# Patient Record
Sex: Male | Born: 1950 | Race: White | Hispanic: No | Marital: Married | Smoking: Never smoker
Health system: Southern US, Community
[De-identification: ages and names within clinical notes are randomized; demographics above are authoritative.]

## PROBLEM LIST (undated history)

## (undated) DIAGNOSIS — E119 Type 2 diabetes mellitus without complications: Secondary | ICD-10-CM

## (undated) DIAGNOSIS — I10 Essential (primary) hypertension: Secondary | ICD-10-CM

---

## 2016-09-21 DIAGNOSIS — E119 Type 2 diabetes mellitus without complications: Secondary | ICD-10-CM | POA: Diagnosis not present

## 2016-09-21 DIAGNOSIS — I10 Essential (primary) hypertension: Secondary | ICD-10-CM | POA: Diagnosis not present

## 2016-09-26 DIAGNOSIS — H9319 Tinnitus, unspecified ear: Secondary | ICD-10-CM | POA: Diagnosis not present

## 2016-09-26 DIAGNOSIS — E119 Type 2 diabetes mellitus without complications: Secondary | ICD-10-CM | POA: Diagnosis not present

## 2016-09-26 DIAGNOSIS — K4090 Unilateral inguinal hernia, without obstruction or gangrene, not specified as recurrent: Secondary | ICD-10-CM | POA: Diagnosis not present

## 2016-09-26 DIAGNOSIS — I10 Essential (primary) hypertension: Secondary | ICD-10-CM | POA: Diagnosis not present

## 2016-10-20 DIAGNOSIS — H9313 Tinnitus, bilateral: Secondary | ICD-10-CM | POA: Diagnosis not present

## 2016-10-20 DIAGNOSIS — H903 Sensorineural hearing loss, bilateral: Secondary | ICD-10-CM | POA: Diagnosis not present

## 2016-10-24 DIAGNOSIS — I10 Essential (primary) hypertension: Secondary | ICD-10-CM | POA: Diagnosis not present

## 2016-10-24 DIAGNOSIS — E118 Type 2 diabetes mellitus with unspecified complications: Secondary | ICD-10-CM | POA: Diagnosis not present

## 2016-10-26 DIAGNOSIS — K4090 Unilateral inguinal hernia, without obstruction or gangrene, not specified as recurrent: Secondary | ICD-10-CM | POA: Diagnosis not present

## 2016-11-30 DIAGNOSIS — K4090 Unilateral inguinal hernia, without obstruction or gangrene, not specified as recurrent: Secondary | ICD-10-CM | POA: Diagnosis not present

## 2017-01-18 DIAGNOSIS — I10 Essential (primary) hypertension: Secondary | ICD-10-CM | POA: Diagnosis not present

## 2017-01-18 DIAGNOSIS — E118 Type 2 diabetes mellitus with unspecified complications: Secondary | ICD-10-CM | POA: Diagnosis not present

## 2017-01-23 DIAGNOSIS — R609 Edema, unspecified: Secondary | ICD-10-CM | POA: Diagnosis not present

## 2017-01-23 DIAGNOSIS — E118 Type 2 diabetes mellitus with unspecified complications: Secondary | ICD-10-CM | POA: Diagnosis not present

## 2017-01-23 DIAGNOSIS — I10 Essential (primary) hypertension: Secondary | ICD-10-CM | POA: Diagnosis not present

## 2017-01-26 DIAGNOSIS — H52223 Regular astigmatism, bilateral: Secondary | ICD-10-CM | POA: Diagnosis not present

## 2017-01-26 DIAGNOSIS — H16223 Keratoconjunctivitis sicca, not specified as Sjogren's, bilateral: Secondary | ICD-10-CM | POA: Diagnosis not present

## 2017-01-26 DIAGNOSIS — H02834 Dermatochalasis of left upper eyelid: Secondary | ICD-10-CM | POA: Diagnosis not present

## 2017-01-26 DIAGNOSIS — H43813 Vitreous degeneration, bilateral: Secondary | ICD-10-CM | POA: Diagnosis not present

## 2017-01-26 DIAGNOSIS — H02831 Dermatochalasis of right upper eyelid: Secondary | ICD-10-CM | POA: Diagnosis not present

## 2017-01-26 DIAGNOSIS — Z961 Presence of intraocular lens: Secondary | ICD-10-CM | POA: Diagnosis not present

## 2017-01-26 DIAGNOSIS — E119 Type 2 diabetes mellitus without complications: Secondary | ICD-10-CM | POA: Diagnosis not present

## 2017-02-08 DIAGNOSIS — I10 Essential (primary) hypertension: Secondary | ICD-10-CM | POA: Diagnosis not present

## 2017-02-08 DIAGNOSIS — R609 Edema, unspecified: Secondary | ICD-10-CM | POA: Diagnosis not present

## 2017-02-08 DIAGNOSIS — E119 Type 2 diabetes mellitus without complications: Secondary | ICD-10-CM | POA: Diagnosis not present

## 2017-02-08 DIAGNOSIS — Z125 Encounter for screening for malignant neoplasm of prostate: Secondary | ICD-10-CM | POA: Diagnosis not present

## 2017-02-13 DIAGNOSIS — R040 Epistaxis: Secondary | ICD-10-CM | POA: Diagnosis not present

## 2017-04-04 DIAGNOSIS — R040 Epistaxis: Secondary | ICD-10-CM | POA: Diagnosis not present

## 2017-09-21 DIAGNOSIS — E119 Type 2 diabetes mellitus without complications: Secondary | ICD-10-CM | POA: Diagnosis not present

## 2017-09-21 DIAGNOSIS — I10 Essential (primary) hypertension: Secondary | ICD-10-CM | POA: Diagnosis not present

## 2017-09-21 DIAGNOSIS — Z125 Encounter for screening for malignant neoplasm of prostate: Secondary | ICD-10-CM | POA: Diagnosis not present

## 2017-09-28 DIAGNOSIS — E7800 Pure hypercholesterolemia, unspecified: Secondary | ICD-10-CM | POA: Diagnosis not present

## 2017-09-28 DIAGNOSIS — I10 Essential (primary) hypertension: Secondary | ICD-10-CM | POA: Diagnosis not present

## 2017-09-28 DIAGNOSIS — E119 Type 2 diabetes mellitus without complications: Secondary | ICD-10-CM | POA: Diagnosis not present

## 2017-10-25 DIAGNOSIS — R69 Illness, unspecified: Secondary | ICD-10-CM | POA: Diagnosis not present

## 2018-01-14 DIAGNOSIS — I10 Essential (primary) hypertension: Secondary | ICD-10-CM | POA: Diagnosis not present

## 2018-01-14 DIAGNOSIS — E119 Type 2 diabetes mellitus without complications: Secondary | ICD-10-CM | POA: Diagnosis not present

## 2018-01-14 DIAGNOSIS — E7800 Pure hypercholesterolemia, unspecified: Secondary | ICD-10-CM | POA: Diagnosis not present

## 2018-01-21 DIAGNOSIS — M5430 Sciatica, unspecified side: Secondary | ICD-10-CM | POA: Diagnosis not present

## 2018-01-21 DIAGNOSIS — Z0000 Encounter for general adult medical examination without abnormal findings: Secondary | ICD-10-CM | POA: Diagnosis not present

## 2018-01-21 DIAGNOSIS — I10 Essential (primary) hypertension: Secondary | ICD-10-CM | POA: Diagnosis not present

## 2018-01-21 DIAGNOSIS — M25552 Pain in left hip: Secondary | ICD-10-CM | POA: Diagnosis not present

## 2018-01-21 DIAGNOSIS — E119 Type 2 diabetes mellitus without complications: Secondary | ICD-10-CM | POA: Diagnosis not present

## 2018-01-21 DIAGNOSIS — E7800 Pure hypercholesterolemia, unspecified: Secondary | ICD-10-CM | POA: Diagnosis not present

## 2018-01-21 DIAGNOSIS — Z23 Encounter for immunization: Secondary | ICD-10-CM | POA: Diagnosis not present

## 2018-02-05 DIAGNOSIS — H16223 Keratoconjunctivitis sicca, not specified as Sjogren's, bilateral: Secondary | ICD-10-CM | POA: Diagnosis not present

## 2018-02-05 DIAGNOSIS — Z7984 Long term (current) use of oral hypoglycemic drugs: Secondary | ICD-10-CM | POA: Diagnosis not present

## 2018-02-05 DIAGNOSIS — H02831 Dermatochalasis of right upper eyelid: Secondary | ICD-10-CM | POA: Diagnosis not present

## 2018-02-05 DIAGNOSIS — H43813 Vitreous degeneration, bilateral: Secondary | ICD-10-CM | POA: Diagnosis not present

## 2018-02-05 DIAGNOSIS — H52223 Regular astigmatism, bilateral: Secondary | ICD-10-CM | POA: Diagnosis not present

## 2018-02-05 DIAGNOSIS — Z961 Presence of intraocular lens: Secondary | ICD-10-CM | POA: Diagnosis not present

## 2018-02-05 DIAGNOSIS — E119 Type 2 diabetes mellitus without complications: Secondary | ICD-10-CM | POA: Diagnosis not present

## 2018-02-05 DIAGNOSIS — H04123 Dry eye syndrome of bilateral lacrimal glands: Secondary | ICD-10-CM | POA: Diagnosis not present

## 2018-02-05 DIAGNOSIS — H02834 Dermatochalasis of left upper eyelid: Secondary | ICD-10-CM | POA: Diagnosis not present

## 2018-02-21 DIAGNOSIS — E119 Type 2 diabetes mellitus without complications: Secondary | ICD-10-CM | POA: Diagnosis not present

## 2018-02-21 DIAGNOSIS — T63481A Toxic effect of venom of other arthropod, accidental (unintentional), initial encounter: Secondary | ICD-10-CM | POA: Diagnosis not present

## 2018-05-03 DIAGNOSIS — H903 Sensorineural hearing loss, bilateral: Secondary | ICD-10-CM | POA: Diagnosis not present

## 2018-05-20 DIAGNOSIS — E119 Type 2 diabetes mellitus without complications: Secondary | ICD-10-CM | POA: Diagnosis not present

## 2018-05-20 DIAGNOSIS — I10 Essential (primary) hypertension: Secondary | ICD-10-CM | POA: Diagnosis not present

## 2018-05-23 DIAGNOSIS — E119 Type 2 diabetes mellitus without complications: Secondary | ICD-10-CM | POA: Diagnosis not present

## 2018-05-23 DIAGNOSIS — I10 Essential (primary) hypertension: Secondary | ICD-10-CM | POA: Diagnosis not present

## 2018-05-23 DIAGNOSIS — E7800 Pure hypercholesterolemia, unspecified: Secondary | ICD-10-CM | POA: Diagnosis not present

## 2018-05-28 DIAGNOSIS — R69 Illness, unspecified: Secondary | ICD-10-CM | POA: Diagnosis not present

## 2018-10-21 DIAGNOSIS — E119 Type 2 diabetes mellitus without complications: Secondary | ICD-10-CM | POA: Diagnosis not present

## 2018-10-21 DIAGNOSIS — E7800 Pure hypercholesterolemia, unspecified: Secondary | ICD-10-CM | POA: Diagnosis not present

## 2018-10-21 DIAGNOSIS — I10 Essential (primary) hypertension: Secondary | ICD-10-CM | POA: Diagnosis not present

## 2018-10-30 DIAGNOSIS — R69 Illness, unspecified: Secondary | ICD-10-CM | POA: Diagnosis not present

## 2018-10-31 DIAGNOSIS — R69 Illness, unspecified: Secondary | ICD-10-CM | POA: Diagnosis not present

## 2018-11-27 DIAGNOSIS — R69 Illness, unspecified: Secondary | ICD-10-CM | POA: Diagnosis not present

## 2019-01-17 DIAGNOSIS — Z0000 Encounter for general adult medical examination without abnormal findings: Secondary | ICD-10-CM | POA: Diagnosis not present

## 2019-01-17 DIAGNOSIS — E119 Type 2 diabetes mellitus without complications: Secondary | ICD-10-CM | POA: Diagnosis not present

## 2019-01-17 DIAGNOSIS — I10 Essential (primary) hypertension: Secondary | ICD-10-CM | POA: Diagnosis not present

## 2019-01-17 DIAGNOSIS — Z125 Encounter for screening for malignant neoplasm of prostate: Secondary | ICD-10-CM | POA: Diagnosis not present

## 2019-01-24 DIAGNOSIS — E7800 Pure hypercholesterolemia, unspecified: Secondary | ICD-10-CM | POA: Diagnosis not present

## 2019-01-24 DIAGNOSIS — Z0000 Encounter for general adult medical examination without abnormal findings: Secondary | ICD-10-CM | POA: Diagnosis not present

## 2019-01-24 DIAGNOSIS — I10 Essential (primary) hypertension: Secondary | ICD-10-CM | POA: Diagnosis not present

## 2019-01-24 DIAGNOSIS — E119 Type 2 diabetes mellitus without complications: Secondary | ICD-10-CM | POA: Diagnosis not present

## 2019-01-24 DIAGNOSIS — M5410 Radiculopathy, site unspecified: Secondary | ICD-10-CM | POA: Diagnosis not present

## 2019-01-31 ENCOUNTER — Other Ambulatory Visit (HOSPITAL_COMMUNITY): Payer: Self-pay | Admitting: Internal Medicine

## 2019-01-31 ENCOUNTER — Other Ambulatory Visit: Payer: Self-pay | Admitting: Internal Medicine

## 2019-01-31 DIAGNOSIS — M5410 Radiculopathy, site unspecified: Secondary | ICD-10-CM

## 2019-02-04 ENCOUNTER — Ambulatory Visit (HOSPITAL_COMMUNITY)
Admission: RE | Admit: 2019-02-04 | Discharge: 2019-02-04 | Disposition: A | Discharge status: Home / Self Care | Payer: Medicare HMO | Source: Ambulatory Visit | Attending: Internal Medicine | Admitting: Internal Medicine

## 2019-02-04 ENCOUNTER — Other Ambulatory Visit: Payer: Self-pay

## 2019-02-04 DIAGNOSIS — M545 Low back pain: Secondary | ICD-10-CM | POA: Diagnosis not present

## 2019-02-04 DIAGNOSIS — M5410 Radiculopathy, site unspecified: Secondary | ICD-10-CM | POA: Diagnosis not present

## 2019-02-11 DIAGNOSIS — H52223 Regular astigmatism, bilateral: Secondary | ICD-10-CM | POA: Diagnosis not present

## 2019-02-11 DIAGNOSIS — Z961 Presence of intraocular lens: Secondary | ICD-10-CM | POA: Diagnosis not present

## 2019-02-11 DIAGNOSIS — H02831 Dermatochalasis of right upper eyelid: Secondary | ICD-10-CM | POA: Diagnosis not present

## 2019-02-11 DIAGNOSIS — Z7984 Long term (current) use of oral hypoglycemic drugs: Secondary | ICD-10-CM | POA: Diagnosis not present

## 2019-02-11 DIAGNOSIS — H5201 Hypermetropia, right eye: Secondary | ICD-10-CM | POA: Diagnosis not present

## 2019-02-11 DIAGNOSIS — E119 Type 2 diabetes mellitus without complications: Secondary | ICD-10-CM | POA: Diagnosis not present

## 2019-02-11 DIAGNOSIS — H02834 Dermatochalasis of left upper eyelid: Secondary | ICD-10-CM | POA: Diagnosis not present

## 2019-02-11 DIAGNOSIS — H16223 Keratoconjunctivitis sicca, not specified as Sjogren's, bilateral: Secondary | ICD-10-CM | POA: Diagnosis not present

## 2019-02-11 DIAGNOSIS — H43813 Vitreous degeneration, bilateral: Secondary | ICD-10-CM | POA: Diagnosis not present

## 2019-02-11 DIAGNOSIS — H5212 Myopia, left eye: Secondary | ICD-10-CM | POA: Diagnosis not present

## 2019-02-21 DIAGNOSIS — M25552 Pain in left hip: Secondary | ICD-10-CM | POA: Diagnosis not present

## 2019-02-21 DIAGNOSIS — M4726 Other spondylosis with radiculopathy, lumbar region: Secondary | ICD-10-CM | POA: Diagnosis not present

## 2019-03-03 DIAGNOSIS — Z7984 Long term (current) use of oral hypoglycemic drugs: Secondary | ICD-10-CM | POA: Diagnosis not present

## 2019-03-03 DIAGNOSIS — E119 Type 2 diabetes mellitus without complications: Secondary | ICD-10-CM | POA: Diagnosis not present

## 2019-03-03 DIAGNOSIS — Z1211 Encounter for screening for malignant neoplasm of colon: Secondary | ICD-10-CM | POA: Diagnosis not present

## 2019-04-10 DIAGNOSIS — D123 Benign neoplasm of transverse colon: Secondary | ICD-10-CM | POA: Diagnosis not present

## 2019-04-10 DIAGNOSIS — Z1211 Encounter for screening for malignant neoplasm of colon: Secondary | ICD-10-CM | POA: Diagnosis not present

## 2019-04-15 DIAGNOSIS — D123 Benign neoplasm of transverse colon: Secondary | ICD-10-CM | POA: Diagnosis not present

## 2019-05-02 DIAGNOSIS — R69 Illness, unspecified: Secondary | ICD-10-CM | POA: Diagnosis not present

## 2019-05-30 DIAGNOSIS — Z23 Encounter for immunization: Secondary | ICD-10-CM | POA: Diagnosis not present

## 2019-07-18 DIAGNOSIS — I10 Essential (primary) hypertension: Secondary | ICD-10-CM | POA: Diagnosis not present

## 2019-07-18 DIAGNOSIS — E119 Type 2 diabetes mellitus without complications: Secondary | ICD-10-CM | POA: Diagnosis not present

## 2019-07-25 DIAGNOSIS — E119 Type 2 diabetes mellitus without complications: Secondary | ICD-10-CM | POA: Diagnosis not present

## 2019-07-25 DIAGNOSIS — E7800 Pure hypercholesterolemia, unspecified: Secondary | ICD-10-CM | POA: Diagnosis not present

## 2019-07-25 DIAGNOSIS — I10 Essential (primary) hypertension: Secondary | ICD-10-CM | POA: Diagnosis not present

## 2019-08-04 DIAGNOSIS — M1612 Unilateral primary osteoarthritis, left hip: Secondary | ICD-10-CM | POA: Diagnosis not present

## 2019-08-04 DIAGNOSIS — M25552 Pain in left hip: Secondary | ICD-10-CM | POA: Diagnosis not present

## 2019-08-28 DIAGNOSIS — R69 Illness, unspecified: Secondary | ICD-10-CM | POA: Diagnosis not present

## 2019-09-13 ENCOUNTER — Ambulatory Visit: Payer: Medicare HMO

## 2019-09-18 ENCOUNTER — Ambulatory Visit: Payer: Medicare HMO

## 2019-09-20 ENCOUNTER — Ambulatory Visit: Payer: Medicare HMO | Attending: Internal Medicine

## 2019-09-20 DIAGNOSIS — Z23 Encounter for immunization: Secondary | ICD-10-CM | POA: Insufficient documentation

## 2019-09-20 NOTE — Progress Notes (Signed)
   Covid-19 Vaccination Clinic  Name:  Kyle Gibson    MRN: [id number] DOB: [birth year]  09/20/2019  [name] was observed post Covid-19 immunization for 15 minutes without incidence. He was provided with Vaccine Information Sheet and instruction to access the V-Safe system.   [name] was instructed to call 911 with any severe reactions post vaccine: Marland Kitchen Difficulty breathing  . Swelling of your face and throat  . A fast heartbeat  . A bad rash all over your body  . Dizziness and weakness    Immunizations Administered    Name Date Dose VIS Date Route   Pfizer COVID-19 Vaccine 09/20/2019  6:00 PM 0.3 mL 07/25/2019 Intramuscular   Manufacturer: [place]   Lot: [id number]   [place]: [id number]

## 2019-10-15 ENCOUNTER — Ambulatory Visit: Payer: Medicare HMO

## 2019-10-27 ENCOUNTER — Ambulatory Visit: Payer: Medicare HMO | Attending: Internal Medicine

## 2019-10-27 DIAGNOSIS — Z23 Encounter for immunization: Secondary | ICD-10-CM

## 2019-10-27 NOTE — Progress Notes (Signed)
   Covid-19 Vaccination Clinic  Name:  Kyle Gibson    MRN: [id number] DOB: August 05, 1951  10/27/2019  Kyle Gibson was observed post Covid-19 immunization for 15 minutes without incident. He was provided with Vaccine Information Sheet and instruction to access the V-Safe system.   Kyle Gibson was instructed to call 911 with any severe reactions post vaccine: Marland Kitchen Difficulty breathing  . Swelling of face and throat  . A fast heartbeat  . A bad rash all over body  . Dizziness and weakness   Immunizations Administered    Name Date Dose VIS Date Route   Pfizer COVID-19 Vaccine 10/27/2019  8:24 AM 0.3 mL 07/25/2019 Intramuscular   Manufacturer: [place]   Lot: [id number]   [place]: [id number]

## 2019-10-31 ENCOUNTER — Other Ambulatory Visit (HOSPITAL_COMMUNITY): Payer: Self-pay | Admitting: Orthopaedic Surgery

## 2019-10-31 ENCOUNTER — Ambulatory Visit (HOSPITAL_COMMUNITY)
Admission: RE | Admit: 2019-10-31 | Discharge: 2019-10-31 | Disposition: A | Discharge status: Home / Self Care | Payer: Medicare HMO | Source: Ambulatory Visit | Attending: Orthopaedic Surgery | Admitting: Orthopaedic Surgery

## 2019-10-31 ENCOUNTER — Other Ambulatory Visit: Payer: Self-pay

## 2019-10-31 DIAGNOSIS — M7989 Other specified soft tissue disorders: Secondary | ICD-10-CM

## 2019-10-31 DIAGNOSIS — M79605 Pain in left leg: Secondary | ICD-10-CM

## 2020-04-07 IMAGING — MR MRI LUMBAR SPINE WITHOUT CONTRAST
4 of 5 series · 18 of 48 positions shown · non-contrast
Comparison: None.

CLINICAL DATA: Worsening low back pain radiating to the left leg
with numbness and tingling.

EXAM:
MRI LUMBAR SPINE WITHOUT CONTRAST
TECHNIQUE: Multiplanar, multisequence MR imaging of the lumbar spine was
performed. No intravenous contrast was administered.

[Series 3: T1 · sagittal · 4.0mm · 0.51mm/px · 3 of 14 slices shown (1 of 2)]
[im 1/14]
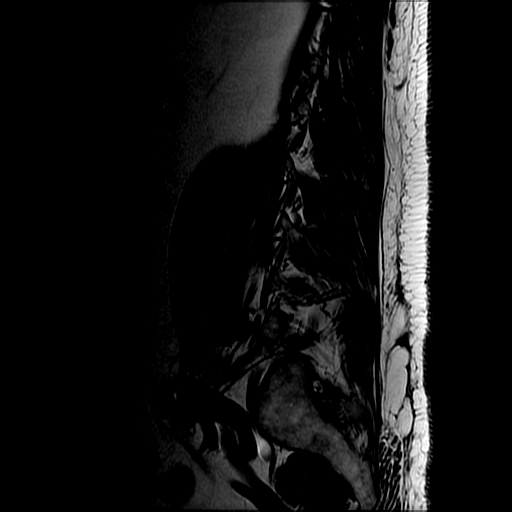
[im 7/14]
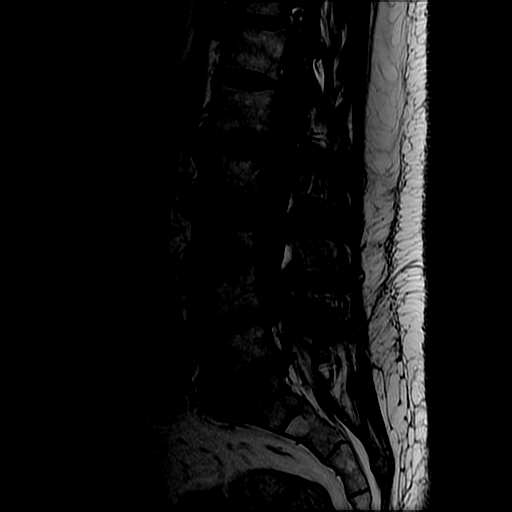
[im 14/14]
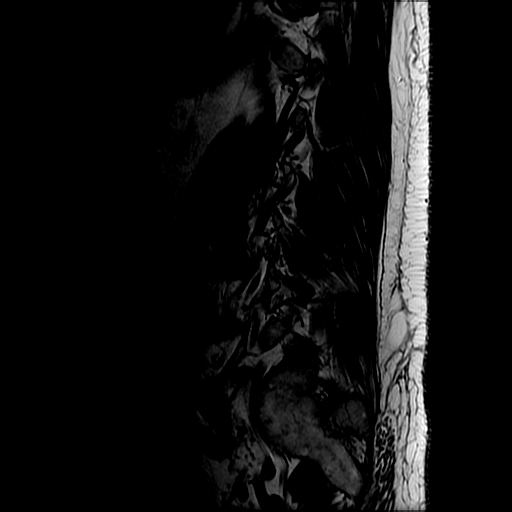

[Series 5: T2 post-contrast · sagittal · 4.0mm · 0.51mm/px · 6 of 14 slices shown]
[im 1/14]
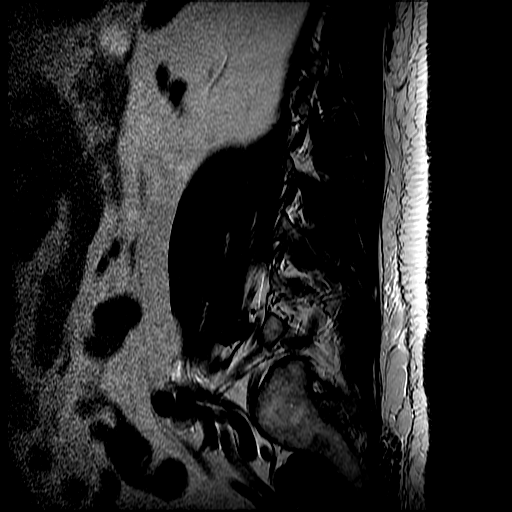
[im 3/14]
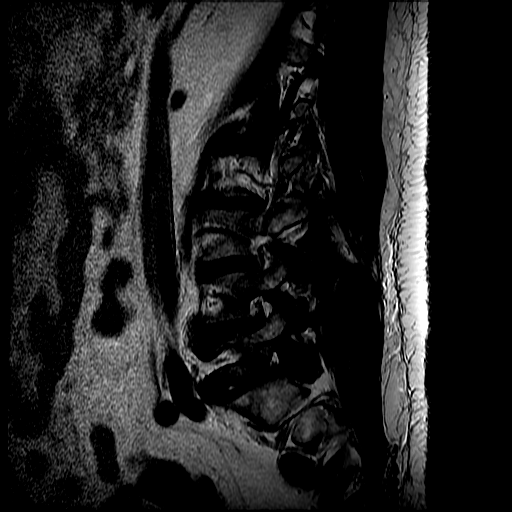
[im 6/14]
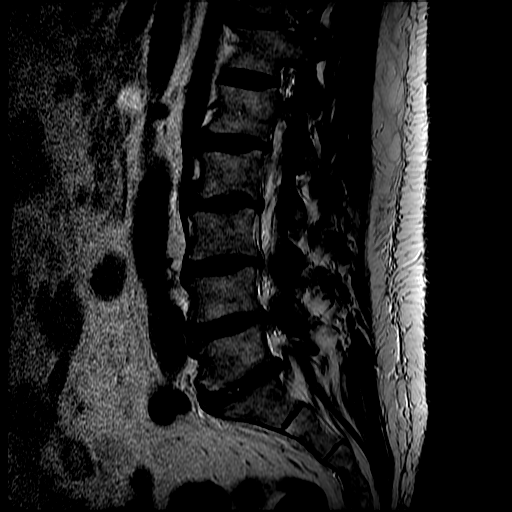
[im 8/14]
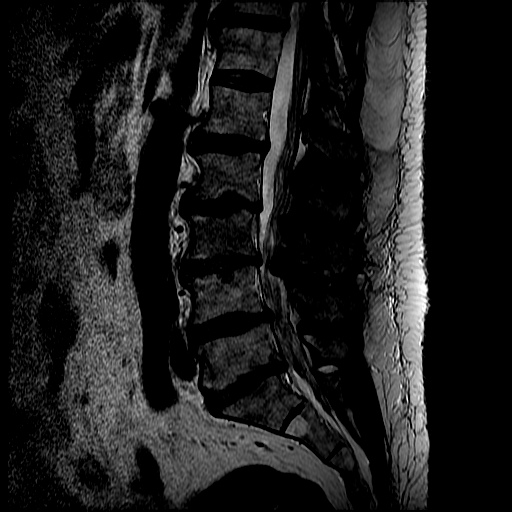
[im 11/14]
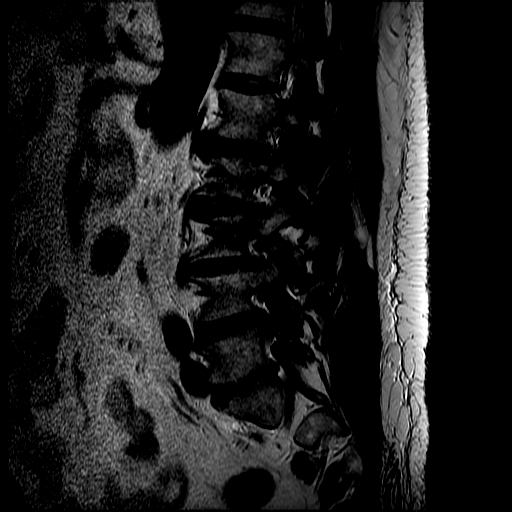
[im 14/14]
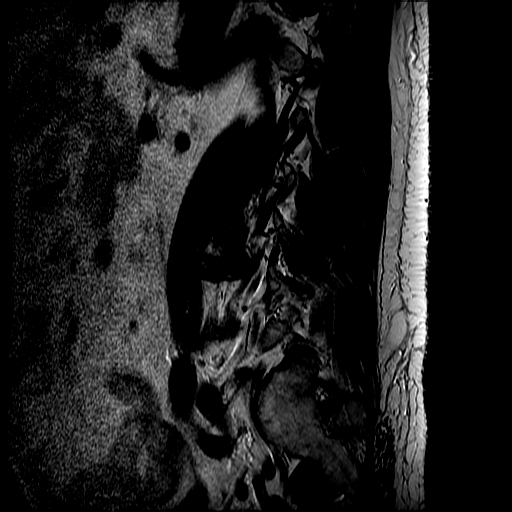

[Series 7: T2 · axial · 4.0mm · 0.41mm/px · z∈[+19,+196]mm · 6 of 41 slices shown]
[im 3/41]
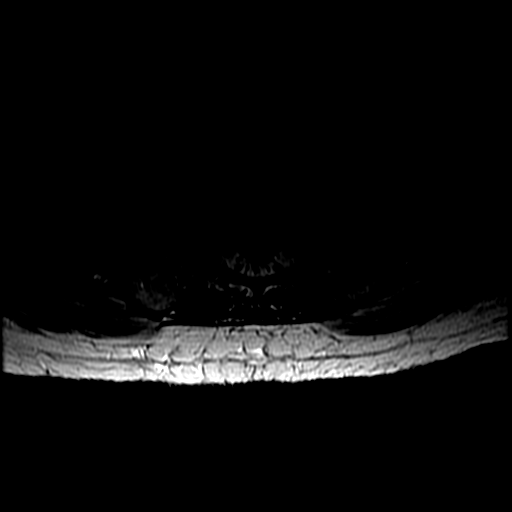
[im 6/41]
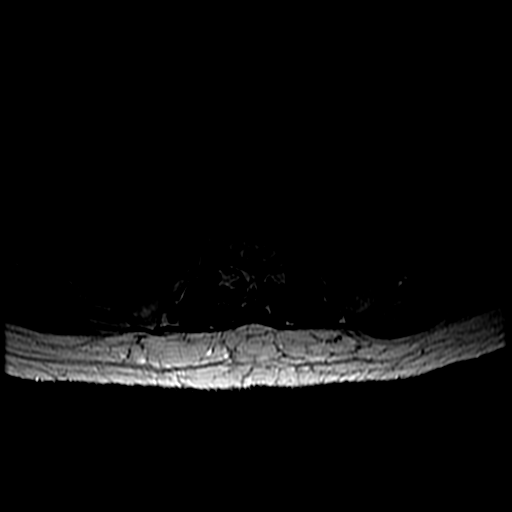
[im 9/41]
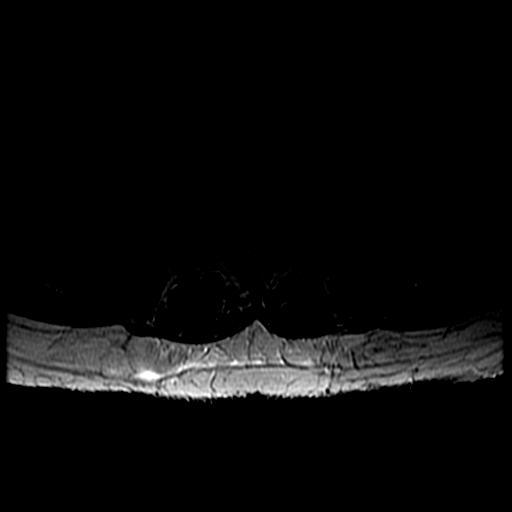
[im 14/41]
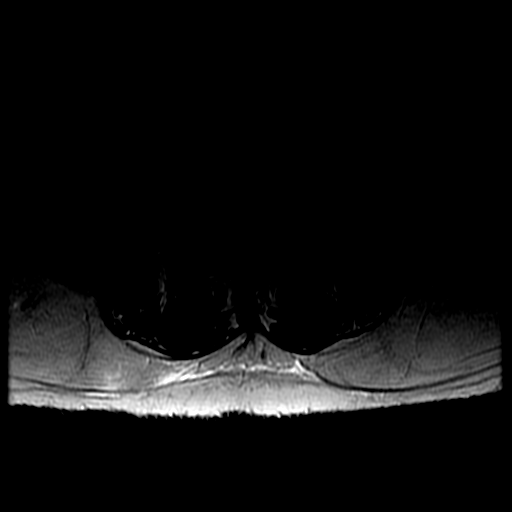
[im 22/41]
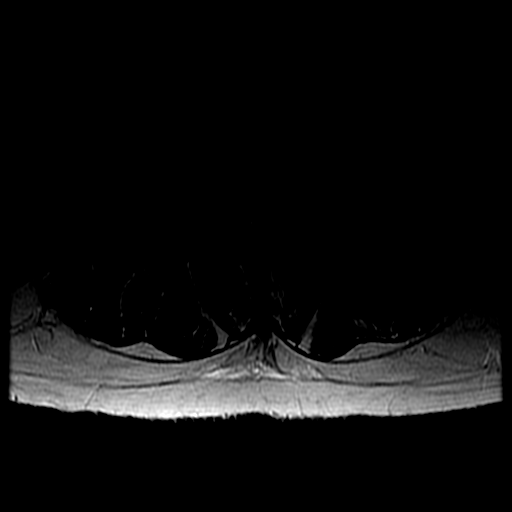
[im 35/41]
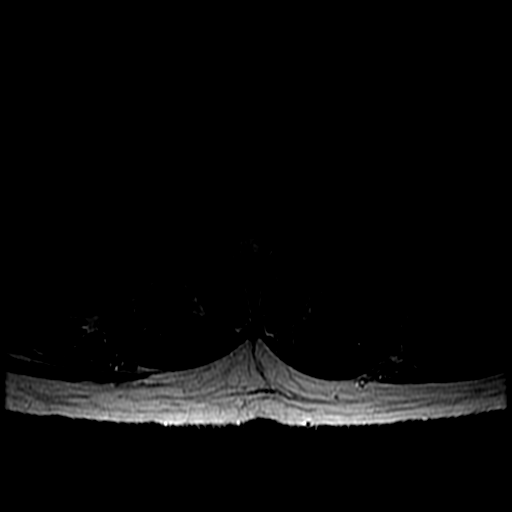

[Series 8: T1 · axial · 4.0mm · 0.41mm/px · z∈[+53,+196]mm · 3 of 37 slices shown (2 of 2)]
[im 6/37]
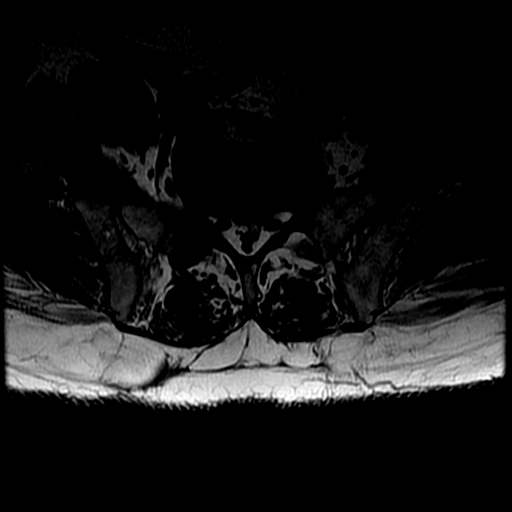
[im 19/37]
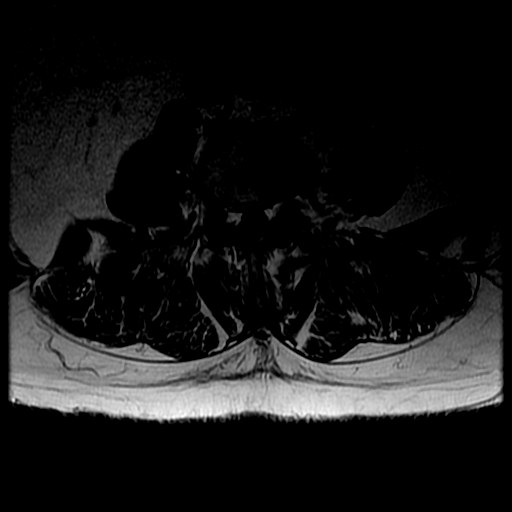
[im 31/37]
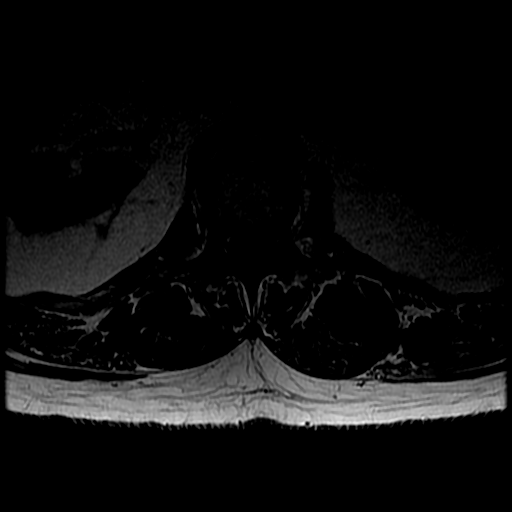

[18 of 48 positions shown; findings below may reference images not displayed]

FINDINGS: Segmentation:  Standard.

Alignment: Trace retrolisthesis of L2 on L3. Mild right convex
curvature of the lumbar spine.

Vertebrae: No fracture, suspicious osseous lesion, or evidence of
discitis. Multilevel degenerative endplate changes with scattered
small Schmorl's nodes.

Conus medullaris and cauda equina: Conus extends to the T12 level.
Conus and cauda equina appear normal.

Paraspinal and other soft tissues: Unremarkable.

Disc levels:

Disc desiccation throughout the lumbar spine. Mild-to-moderate disc
space narrowing at L2-3, L3-4, and L5-S1 with mild narrowing at L1-2
and L4-5.

T12-L1: Mild facet hypertrophy without disc herniation or stenosis.

L1-2: Mild circumferential disc bulging slightly greater to the left
and moderate facet and ligamentum flavum hypertrophy without
stenosis.

L2-3: Circumferential disc bulging and moderate facet and ligamentum
flavum hypertrophy without stenosis.

L3-4: Circumferential disc bulging greater to the left and moderate
facet and ligamentum flavum hypertrophy result in mild spinal
stenosis, mild bilateral lateral recess stenosis, and mild left
neural foraminal stenosis.

L4-5: Circumferential disc bulging and moderate facet and ligamentum
flavum hypertrophy result in mild spinal stenosis, mild-to-moderate
bilateral lateral recess stenosis, and mild right neural foraminal
stenosis. The L5 nerve roots could be affected in the lateral
recesses.

L5-S1: Circumferential disc bulging, a small right foraminal disc
extrusion, and mild-to-moderate right greater than left facet and
ligamentum flavum hypertrophy result in moderate right neural
foraminal stenosis with potential right L5 nerve root impingement.
There is a small right paracentral disc protrusion which results in
mild right lateral recess stenosis, contacting though not
compressing the right S1 nerve root. The left lateral recess is also
mildly narrowed. No significant spinal stenosis.
IMPRESSION: 1. Moderately advanced lumbar disc and facet degeneration.
2. Mild-to-moderate bilateral lateral recess stenosis at L4-5
potentially affecting the L5 nerve roots.
3. Mild lateral recess stenosis at L3-4 and L5-S1 with a right
paracentral disc protrusion noted at the latter.
4. Moderate right neural foraminal stenosis at L5-S1 due to a small
disc extrusion.

## 2023-02-23 ENCOUNTER — Other Ambulatory Visit: Payer: Self-pay

## 2023-02-23 ENCOUNTER — Emergency Department (HOSPITAL_COMMUNITY): Payer: Medicare HMO

## 2023-02-23 ENCOUNTER — Encounter (HOSPITAL_COMMUNITY): Payer: Self-pay | Admitting: Emergency Medicine

## 2023-02-23 ENCOUNTER — Emergency Department (HOSPITAL_COMMUNITY)
Admission: EM | Admit: 2023-02-23 | Discharge: 2023-02-23 | Disposition: A | Discharge status: Home / Self Care | Payer: Medicare HMO | Attending: Emergency Medicine | Admitting: Emergency Medicine

## 2023-02-23 DIAGNOSIS — Z7901 Long term (current) use of anticoagulants: Secondary | ICD-10-CM | POA: Diagnosis not present

## 2023-02-23 DIAGNOSIS — Z79899 Other long term (current) drug therapy: Secondary | ICD-10-CM | POA: Insufficient documentation

## 2023-02-23 DIAGNOSIS — I4891 Unspecified atrial fibrillation: Secondary | ICD-10-CM | POA: Diagnosis not present

## 2023-02-23 DIAGNOSIS — E119 Type 2 diabetes mellitus without complications: Secondary | ICD-10-CM | POA: Insufficient documentation

## 2023-02-23 DIAGNOSIS — I10 Essential (primary) hypertension: Secondary | ICD-10-CM | POA: Insufficient documentation

## 2023-02-23 DIAGNOSIS — R002 Palpitations: Secondary | ICD-10-CM | POA: Diagnosis present

## 2023-02-23 HISTORY — DX: Essential (primary) hypertension: I10

## 2023-02-23 HISTORY — DX: Type 2 diabetes mellitus without complications: E11.9

## 2023-02-23 LAB — CBC WITH DIFFERENTIAL/PLATELET
Abs Immature Granulocytes: 0.11 10*3/uL — ABNORMAL HIGH (ref 0.00–0.07)
Basophils Absolute: 0 10*3/uL (ref 0.0–0.1)
Basophils Relative: 0 %
Eosinophils Absolute: 0.1 10*3/uL (ref 0.0–0.5)
Eosinophils Relative: 1 %
HCT: 45.9 % (ref 39.0–52.0)
Hemoglobin: 15.4 g/dL (ref 13.0–17.0)
Immature Granulocytes: 1 %
Lymphocytes Relative: 17 %
Lymphs Abs: 2.2 10*3/uL (ref 0.7–4.0)
MCH: 30.1 pg (ref 26.0–34.0)
MCHC: 33.6 g/dL (ref 30.0–36.0)
MCV: 89.8 fL (ref 80.0–100.0)
Monocytes Absolute: 1 10*3/uL (ref 0.1–1.0)
Monocytes Relative: 8 %
Neutro Abs: 9.5 10*3/uL — ABNORMAL HIGH (ref 1.7–7.7)
Neutrophils Relative %: 73 %
Platelets: 207 10*3/uL (ref 150–400)
RBC: 5.11 MIL/uL (ref 4.22–5.81)
RDW: 13.1 % (ref 11.5–15.5)
WBC: 12.8 10*3/uL — ABNORMAL HIGH (ref 4.0–10.5)
nRBC: 0 % (ref 0.0–0.2)

## 2023-02-23 LAB — COMPREHENSIVE METABOLIC PANEL
ALT: 17 U/L (ref 0–44)
AST: 26 U/L (ref 15–41)
Albumin: 3.8 g/dL (ref 3.5–5.0)
Alkaline Phosphatase: 54 U/L (ref 38–126)
Anion gap: 9 (ref 5–15)
BUN: 17 mg/dL (ref 8–23)
CO2: 24 mmol/L (ref 22–32)
Calcium: 8.9 mg/dL (ref 8.9–10.3)
Chloride: 100 mmol/L (ref 98–111)
Creatinine, Ser: 0.96 mg/dL (ref 0.61–1.24)
GFR, Estimated: >60 mL/min (ref >60)
Glucose, Bld: 235 mg/dL — ABNORMAL HIGH (ref 70–99)
Potassium: 3.5 mmol/L (ref 3.5–5.1)
Sodium: 133 mmol/L — ABNORMAL LOW (ref 135–145)
Total Bilirubin: 0.8 mg/dL (ref 0.3–1.2)
Total Protein: 6.6 g/dL (ref 6.5–8.1)

## 2023-02-23 LAB — MAGNESIUM: Magnesium: 1.7 mg/dL (ref 1.7–2.4)

## 2023-02-23 MED ORDER — METOPROLOL SUCCINATE ER 25 MG PO TB24: 25.0000 mg | ORAL_TABLET | Freq: Every day | ORAL | 0 refills | Status: DC

## 2023-02-23 MED ORDER — ETOMIDATE 2 MG/ML IV SOLN
10.0000 mg | Freq: Once | INTRAVENOUS | Status: AC
  Administered 2023-02-23: 10 mg via INTRAVENOUS
  Filled 2023-02-23: qty 10

## 2023-02-23 MED ORDER — APIXABAN 5 MG PO TABS: 5.0000 mg | ORAL_TABLET | Freq: Two times a day (BID) | ORAL | 0 refills | Status: DC

## 2023-02-23 MED ORDER — LACTATED RINGERS IV BOLUS
1000.0000 mL | Freq: Once | INTRAVENOUS | Status: AC
  Administered 2023-02-23: 1000 mL via INTRAVENOUS

## 2023-02-23 MED ORDER — METOPROLOL TARTRATE 5 MG/5ML IV SOLN
2.5000 mg | Freq: Once | INTRAVENOUS | Status: AC
  Administered 2023-02-23: 2.5 mg via INTRAVENOUS
  Filled 2023-02-23: qty 5

## 2023-02-23 MED ORDER — APIXABAN 5 MG PO TABS
5.0000 mg | ORAL_TABLET | Freq: Once | ORAL | Status: AC
  Administered 2023-02-23: 5 mg via ORAL
  Filled 2023-02-23: qty 1

## 2023-02-23 NOTE — ED Notes (Signed)
Etomidate 9mg  administered to RAC via 20g IV, tolerated well.

## 2023-02-23 NOTE — Progress Notes (Signed)
RT at bedside for a conscious sedation procedure. Airway equipment at bedside. Patient placed on [place] 2L with ETCO2 monitoring.

## 2023-02-23 NOTE — ED Notes (Signed)
Shocked received, pt tolerated well.

## 2023-02-23 NOTE — ED Triage Notes (Signed)
Patient reports palpitations with mild exertional dyspnea onset last night .

## 2023-02-23 NOTE — ED Notes (Signed)
Timeout for cardioversion obtained. Dr Clayborne Dana, respiratory, Eula Listen present in room.

## 2023-02-23 NOTE — ED Provider Notes (Signed)
[facility] AT [facility] Provider Note   CSN: [id number] Arrival date & time: 02/23/23  0427     History  Chief Complaint  Patient presents with   Palpitations    Kyle Gibson is a 72 y.o. male.  72 year old male with a history of hypertension, diabetes who presents ER today secondary to palpitations.  Patient is he noticed some started last night around 7:00.  No history of the same.  He did recently reduce his dose of metoprolol from 25-12.5 per her doctors orders however that was for blood pressure.  Has been eating and drinking normally recently.  Has been Ozempic.  No recent illnesses.  No other associated symptoms.   Palpitations      Home Medications Prior to Admission medications   Medication Sig Start Date End Date Taking? Authorizing Provider  apixaban (ELIQUIS) 5 MG TABS tablet Take 1 tablet (5 mg total) by mouth 2 (two) times daily. 02/23/23 03/25/23 Yes Kyiesha Millward, Barbara Cower, MD  metoprolol succinate (TOPROL-XL) 25 MG 24 hr tablet Take 1 tablet (25 mg total) by mouth daily. 02/23/23  Yes Kyoko Elsea, Barbara Cower, MD      Allergies    Nitroglycerin    Review of Systems   Review of Systems  Cardiovascular:  Positive for palpitations.    Physical Exam Updated Vital Signs BP (!) 153/96   Pulse (!) 111   Temp 98 F (36.7 C) (Oral)   Resp 17   SpO2 96%  Physical Exam Vitals and nursing note reviewed.  Constitutional:      Appearance: He is well-developed.  HENT:     Head: Normocephalic and atraumatic.  Eyes:     Pupils: Pupils are equal, round, and reactive to light.  Cardiovascular:     Rate and Rhythm: Tachycardia present. Rhythm irregular.  Pulmonary:     Effort: Pulmonary effort is normal. No respiratory distress.  Abdominal:     General: There is no distension.  Musculoskeletal:        General: Normal range of motion.     Cervical back: Normal range of motion.  Skin:    General: Skin is warm and dry.  Neurological:     General:  No focal deficit present.     Mental Status: He is alert.     ED Results / Procedures / Treatments   Labs (all labs ordered are listed, but only abnormal results are displayed) Labs Reviewed  CBC WITH DIFFERENTIAL/PLATELET - Abnormal; Notable for the following components:      Result Value   WBC 12.8 (*)    Neutro Abs 9.5 (*)    Abs Immature Granulocytes 0.11 (*)    All other components within normal limits  COMPREHENSIVE METABOLIC PANEL - Abnormal; Notable for the following components:   Sodium 133 (*)    Glucose, Bld 235 (*)    All other components within normal limits  MAGNESIUM    EKG EKG Interpretation Date/Time:  Friday February 23 2023 04:36:02 EDT Ventricular Rate:  138 PR Interval:    QRS Duration:  88 QT Interval:  253 QTC Calculation: 384 R Axis:   77  Text Interpretation: Atrial fibrillation Borderline repolarization abnormality Confirmed by Marily Memos [phone]) on 02/23/2023 5:34:00 AM   EKG Interpretation Date/Time:  Friday February 23 2023 07:28:42 EDT Ventricular Rate:  108 PR Interval:  205 QRS Duration:  90 QT Interval:  344 QTC Calculation: 462 R Axis:   51  Text Interpretation: Sinus tachycardia Confirmed by Antwione Picotte, Barbara Cower (  [id number]) on 02/23/2023 7:32:37 AM         Radiology DG Chest Portable 1 View  Result Date: 02/23/2023 CLINICAL DATA:  New onset atrial fibrillation. EXAM: PORTABLE CHEST 1 VIEW COMPARISON:  None Available. FINDINGS: The heart size and mediastinal contours are within normal limits. There is calcification of the transverse aorta. Both lungs are clear of infiltrates. There are calcified granulomas in the right upper and mid lung field. The visualized skeletal structures are intact, with osteopenia. IMPRESSION: No active disease. Old granulomatous disease. Aortic atherosclerosis. Electronically Signed   By: Almira Bar M.D.   On: 02/23/2023 06:28    Procedures .Critical Care  Performed by: Marily Memos, MD Authorized by: Marily Memos, MD   Critical care provider statement:    Critical care time (minutes):  30   Critical care was necessary to treat or prevent imminent or life-threatening deterioration of the following conditions:  Cardiac failure and circulatory failure   Critical care was time spent personally by me on the following activities:  Development of treatment plan with patient or surrogate, discussions with consultants, evaluation of patient's response to treatment, examination of patient, ordering and review of laboratory studies, ordering and review of radiographic studies, ordering and performing treatments and interventions, pulse oximetry, re-evaluation of patient's condition and review of old charts .Sedation  Date/Time: 02/23/2023 7:33 AM  Performed by: Marily Memos, MD Authorized by: Marily Memos, MD   Consent:    Consent obtained:  Verbal   Consent given by:  Patient   Risks discussed:  Allergic reaction, dysrhythmia, inadequate sedation, nausea, vomiting, respiratory compromise necessitating ventilatory assistance and intubation and prolonged hypoxia resulting in organ damage   Alternatives discussed:  Analgesia without sedation and anxiolysis Universal protocol:    Procedure explained and questions answered to patient or proxy's satisfaction: yes     Test results available: yes     Imaging studies available: yes     Immediately prior to procedure, a time out was called: yes   Indications:    Procedure performed:  Cardioversion   Procedure necessitating sedation performed by:  Physician performing sedation Pre-sedation assessment:    Time since last food or drink:  >6 hours   ASA classification: class 2 - patient with mild systemic disease     Mouth opening:  2 finger widths   Thyromental distance:  3 finger widths   Mallampati score:  II - soft palate, uvula, fauces visible   Neck mobility: normal     Pre-sedation assessments completed and reviewed: airway patency, cardiovascular  function, hydration status, mental status, nausea/vomiting, pain level, respiratory function and temperature   Immediate pre-procedure details:    Reassessment: Patient reassessed immediately prior to procedure     Reviewed: vital signs     Verified: bag valve mask available, emergency equipment available, intubation equipment available, IV patency confirmed and oxygen available   Procedure details (see MAR for exact dosages):    Preoxygenation:  Nasal cannula   Sedation:  Etomidate   Intended level of sedation: deep   Intra-procedure monitoring:  Blood pressure monitoring, cardiac monitor, frequent vital sign checks, frequent LOC assessments, continuous capnometry and continuous pulse oximetry   Intra-procedure events: none     Total Provider sedation time (minutes):  25 Post-procedure details:    Attendance: Constant attendance by certified staff until patient recovered     Post-sedation assessments completed and reviewed: airway patency, cardiovascular function, hydration status, mental status, nausea/vomiting, pain level, respiratory function and temperature  Patient is stable for discharge or admission: yes     Procedure completion:  Tolerated well, no immediate complications .Cardioversion  Date/Time: 02/23/2023 7:35 AM  Performed by: Marily Memos, MD Authorized by: Marily Memos, MD   Consent:    Consent obtained:  Verbal   Consent given by:  Patient   Risks discussed:  Cutaneous burn, death, induced arrhythmia and pain   Alternatives discussed:  Rate-control medication, no treatment, anti-coagulation medication, delayed treatment and observation Pre-procedure details:    Cardioversion basis:  Emergent   Rhythm:  Atrial fibrillation   Electrode placement:  Anterior-posterior Patient sedated: Yes. Refer to sedation procedure documentation for details of sedation.  Attempt one:    Cardioversion mode:  Synchronous   Waveform:  Biphasic   Shock (Joules):  120   Shock  outcome:  Conversion to normal sinus rhythm (sinus tachycardia initially) Post-procedure details:    Patient status:  Awake   Patient tolerance of procedure:  Tolerated well, no immediate complications     Medications Ordered in ED Medications  etomidate (AMIDATE) injection 10 mg (has no administration in time range)  lactated ringers bolus 1,000 mL (has no administration in time range)  apixaban (ELIQUIS) tablet 5 mg (has no administration in time range)  metoprolol tartrate (LOPRESSOR) injection 2.5 mg (2.5 mg Intravenous Given 02/23/23 0550)  lactated ringers bolus 1,000 mL (0 mLs Intravenous Stopped 02/23/23 5284)    ED Course/ Medical Decision Making/ A&P                             Medical Decision Making Amount and/or Complexity of Data Reviewed Labs: ordered. Radiology: ordered.  Risk Prescription drug management.  Patient is in A-fib with rapid ventricular response.  Will give a dose of metoprolol and some fluids while we wait for his labs to come back and preparing him for possible cardioversion.  Patient's not sure that is what he wants to do so we will need to have a discussion prior if he doesn't spontaneously cardiovert. Patient elected to get electrical cardioversion. See procedure notes above. Converted to sinus tach. Eliquis ordered. Afib follow up ordered.   CHA2DS2/VAS Stroke Risk Points  Current as of 13 minutes ago     3 >= 2 Points: High Risk  1 to 1.99 Points: Medium Risk  0 Points: Low Risk    No Change      Details    This score determines the patient's risk of having a stroke if the  patient has atrial fibrillation.       Points Metrics  0 Has Congestive Heart Failure:  No    Current as of 13 minutes ago  0 Has Vascular Disease:  No    Current as of 13 minutes ago  1 Has Hypertension:  Yes     Current as of 13 minutes ago  1 Age:  58    Current as of 13 minutes ago  1 Has Diabetes:  Yes     Current as of 13 minutes ago  0 Had Stroke:  No   Had TIA:  No  Had Thromboembolism:  No    Current as of 13 minutes ago  0 Male:  No    Current as of 13 minutes ago    Final Clinical Impression(s) / ED Diagnoses Final diagnoses:  Atrial fibrillation with rapid ventricular response (HCC)    Rx / DC Orders ED Discharge Orders  Ordered    Amb Referral to AFIB Clinic        02/23/23 0715    apixaban (ELIQUIS) 5 MG TABS tablet  2 times daily        02/23/23 0716    metoprolol succinate (TOPROL-XL) 25 MG 24 hr tablet  Daily        02/23/23 0716              Taniesha Glanz, Barbara Cower, MD 02/28/23 [phone]

## 2023-02-26 ENCOUNTER — Emergency Department (HOSPITAL_COMMUNITY): Payer: Medicare HMO

## 2023-02-26 ENCOUNTER — Encounter (HOSPITAL_COMMUNITY): Payer: Self-pay

## 2023-02-26 ENCOUNTER — Emergency Department (HOSPITAL_COMMUNITY)
Admission: EM | Admit: 2023-02-26 | Discharge: 2023-02-26 | Disposition: A | Discharge status: Home / Self Care | Payer: Medicare HMO | Attending: Emergency Medicine | Admitting: Emergency Medicine

## 2023-02-26 DIAGNOSIS — E119 Type 2 diabetes mellitus without complications: Secondary | ICD-10-CM | POA: Insufficient documentation

## 2023-02-26 DIAGNOSIS — R002 Palpitations: Secondary | ICD-10-CM | POA: Insufficient documentation

## 2023-02-26 DIAGNOSIS — I10 Essential (primary) hypertension: Secondary | ICD-10-CM | POA: Diagnosis not present

## 2023-02-26 DIAGNOSIS — Z7901 Long term (current) use of anticoagulants: Secondary | ICD-10-CM | POA: Insufficient documentation

## 2023-02-26 DIAGNOSIS — R42 Dizziness and giddiness: Secondary | ICD-10-CM | POA: Diagnosis not present

## 2023-02-26 LAB — BASIC METABOLIC PANEL
Anion gap: 16 — ABNORMAL HIGH (ref 5–15)
BUN: 14 mg/dL (ref 8–23)
CO2: 22 mmol/L (ref 22–32)
Calcium: 9.4 mg/dL (ref 8.9–10.3)
Chloride: 99 mmol/L (ref 98–111)
Creatinine, Ser: 0.99 mg/dL (ref 0.61–1.24)
GFR, Estimated: >60 mL/min (ref >60)
Glucose, Bld: 197 mg/dL — ABNORMAL HIGH (ref 70–99)
Potassium: 3.7 mmol/L (ref 3.5–5.1)
Sodium: 137 mmol/L (ref 135–145)

## 2023-02-26 LAB — CBC
HCT: 48.7 % (ref 39.0–52.0)
Hemoglobin: 16.3 g/dL (ref 13.0–17.0)
MCH: 30.2 pg (ref 26.0–34.0)
MCHC: 33.5 g/dL (ref 30.0–36.0)
MCV: 90.4 fL (ref 80.0–100.0)
Platelets: 208 10*3/uL (ref 150–400)
RBC: 5.39 MIL/uL (ref 4.22–5.81)
RDW: 13 % (ref 11.5–15.5)
WBC: 9 10*3/uL (ref 4.0–10.5)
nRBC: 0 % (ref 0.0–0.2)

## 2023-02-26 LAB — MAGNESIUM: Magnesium: 1.8 mg/dL (ref 1.7–2.4)

## 2023-02-26 LAB — TROPONIN I (HIGH SENSITIVITY)
Troponin I (High Sensitivity): 8 ng/L (ref <18)
Troponin I (High Sensitivity): 8 ng/L (ref <18)

## 2023-02-26 MED ORDER — MAGNESIUM SULFATE 2 GM/50ML IV SOLN
2.0000 g | Freq: Once | INTRAVENOUS | Status: AC
  Administered 2023-02-26: 2 g via INTRAVENOUS
  Filled 2023-02-26: qty 50

## 2023-02-26 MED ORDER — POTASSIUM CHLORIDE CRYS ER 20 MEQ PO TBCR
40.0000 meq | EXTENDED_RELEASE_TABLET | Freq: Once | ORAL | Status: AC
  Administered 2023-02-26: 40 meq via ORAL
  Filled 2023-02-26: qty 2

## 2023-02-26 MED ORDER — IOHEXOL 350 MG/ML SOLN
75.0000 mL | Freq: Once | INTRAVENOUS | Status: AC | PRN
  Administered 2023-02-26: 75 mL via INTRAVENOUS

## 2023-02-26 MED ORDER — LACTATED RINGERS IV BOLUS
500.0000 mL | Freq: Once | INTRAVENOUS | Status: AC
  Administered 2023-02-26: 500 mL via INTRAVENOUS

## 2023-02-26 MED ORDER — METOPROLOL TARTRATE 25 MG PO TABS
25.0000 mg | ORAL_TABLET | Freq: Once | ORAL | Status: AC
  Administered 2023-02-26: 25 mg via ORAL
  Filled 2023-02-26: qty 1

## 2023-02-26 NOTE — ED Notes (Signed)
Pt verbalized understanding of discharge instructions. IV removed. Pt dressed for discharge. Pt ambulated from ed with steady gait.

## 2023-02-26 NOTE — ED Provider Notes (Signed)
[place] EMERGENCY DEPARTMENT AT [facility] Provider Note   CSN: [id number] Arrival date & time: 02/26/23  4098     History  Chief Complaint  Patient presents with   Chest Pain    Kyle Gibson is a 72 y.o. male.   Chest Pain Associated symptoms: dizziness and palpitations   Patient presents for dizziness and palpitations.  Medical history includes DM, HTN.  He was seen in the ED 3 days ago for similar symptoms.  At the time, he was found to have atrial fibrillation with RVR.  He underwent cardioversion.  He was started on Eliquis.  His 12.5 mg dose metoprolol was increased to 25 mg.  He has been adherent to his home medications.  He had some brief chest pain last night which resolved.  He attributes this to muscular pain.  This morning, he awoke in his normal state of health.  He took his morning medications.  Approximately 8 AM, he had onset of dizziness and palpitations.  He denies any other associated symptoms.     Home Medications Prior to Admission medications   Medication Sig Start Date End Date Taking? Authorizing Provider  apixaban (ELIQUIS) 5 MG TABS tablet Take 1 tablet (5 mg total) by mouth 2 (two) times daily. 02/23/23 03/25/23  Mesner, Barbara Cower, MD  metoprolol succinate (TOPROL-XL) 25 MG 24 hr tablet Take 1 tablet (25 mg total) by mouth daily. 02/23/23   Mesner, Barbara Cower, MD      Allergies    Nitroglycerin    Review of Systems   Review of Systems  Cardiovascular:  Positive for palpitations.  Neurological:  Positive for dizziness.  All other systems reviewed and are negative.   Physical Exam Updated Vital Signs BP (!) 153/100   Pulse 90   Temp 98.2 F (36.8 C)   Resp 17   Ht 5\' 7"  (1.702 m)   Wt 81.6 kg   SpO2 94%   BMI 28.19 kg/m  Physical Exam Vitals and nursing note reviewed.  Constitutional:      General: He is not in acute distress.    Appearance: He is well-developed. He is not ill-appearing, toxic-appearing or diaphoretic.  HENT:      Head: Normocephalic and atraumatic.  Eyes:     Extraocular Movements: Extraocular movements intact.     Conjunctiva/sclera: Conjunctivae normal.  Cardiovascular:     Rate and Rhythm: Regular rhythm. Tachycardia present.     Heart sounds: No murmur heard. Pulmonary:     Effort: Pulmonary effort is normal. No respiratory distress.     Breath sounds: Normal breath sounds. No decreased breath sounds, wheezing, rhonchi or rales.  Chest:     Chest wall: No tenderness.  Abdominal:     Palpations: Abdomen is soft.     Tenderness: There is no abdominal tenderness.  Musculoskeletal:        General: No swelling. Normal range of motion.     Cervical back: Normal range of motion and neck supple.     Right lower leg: No edema.     Left lower leg: No edema.  Skin:    General: Skin is warm and dry.     Coloration: Skin is not cyanotic or pale.  Neurological:     General: No focal deficit present.     Mental Status: He is alert and oriented to person, place, and time.  Psychiatric:        Mood and Affect: Mood normal.  Behavior: Behavior normal.     ED Results / Procedures / Treatments   Labs (all labs ordered are listed, but only abnormal results are displayed) Labs Reviewed  BASIC METABOLIC PANEL - Abnormal; Notable for the following components:      Result Value   Glucose, Bld 197 (*)    Anion gap 16 (*)    All other components within normal limits  CBC  MAGNESIUM  TROPONIN I (HIGH SENSITIVITY)  TROPONIN I (HIGH SENSITIVITY)    EKG EKG Interpretation Date/Time:  Monday February 26 2023 08:50:56 EDT Ventricular Rate:  111 PR Interval:  188 QRS Duration:  91 QT Interval:  329 QTC Calculation: 447 R Axis:   56  Text Interpretation: Sinus tachycardia Multiple premature complexes, vent & supraven Confirmed by Gloris Manchester [phone]) on 02/26/2023 8:53:39 AM  Radiology CT Angio Chest PE W and/or Wo Contrast  Result Date: 02/26/2023 CLINICAL DATA:  Pulmonary embolism suspected.   Atrial fibrillation. EXAM: CT ANGIOGRAPHY CHEST WITH CONTRAST TECHNIQUE: Multidetector CT imaging of the chest was performed using the standard protocol during bolus administration of intravenous contrast. Multiplanar CT image reconstructions and MIPs were obtained to evaluate the vascular anatomy. RADIATION DOSE REDUCTION: This exam was performed according to the departmental dose-optimization program which includes automated exposure control, adjustment of the mA and/or kV according to patient size and/or use of iterative reconstruction technique. CONTRAST:  75mL OMNIPAQUE IOHEXOL 350 MG/ML SOLN COMPARISON:  None Available. FINDINGS: Cardiovascular: Satisfactory opacification of the pulmonary arteries to the segmental level. No evidence of pulmonary embolism. Normal heart size. Mild coronary artery atherosclerotic calcifications. No pericardial effusion. Mild atherosclerotic calcification of aorta. Mediastinum/Nodes: No enlarged mediastinal, hilar, or axillary lymph nodes. Thyroid gland, trachea, and esophagus demonstrate no significant findings. Lungs/Pleura: Mild centrilobular emphysematous changes. No focal consolidation or pleural effusion. Upper Abdomen: No acute abnormality. Musculoskeletal: No chest wall abnormality. No acute or significant osseous findings. Review of the MIP images confirms the above findings. IMPRESSION: 1. No evidence of pulmonary embolism. 2. Mild centrilobular emphysematous changes. No focal consolidation or pleural effusion. 3. Mild coronary artery atherosclerotic calcifications. Aortic Atherosclerosis (ICD10-I70.0) and Emphysema (ICD10-J43.9). Electronically Signed   By: Larose Hires D.O.   On: 02/26/2023 13:10   DG Chest Portable 1 View  Result Date: 02/26/2023 EXAM: PORTABLE CHEST 1 VIEW COMPARISON:  02/23/2023. FINDINGS: Stable subcentimeter sized calcified granulomas in the right lung. Bilateral lung fields are otherwise clear. Bilateral costophrenic angles are clear. Normal  cardio-mediastinal silhouette. Aortic knob calcifications noted. No acute osseous abnormalities. The soft tissues are within normal limits. IMPRESSION: *No active disease. Old granulomatous disease. Aortic Atherosclerosis (ICD10-I70.0). Electronically Signed   By: Jules Schick M.D.   On: 02/26/2023 09:24    Procedures Procedures    Medications Ordered in ED Medications  lactated ringers bolus 500 mL (0 mLs Intravenous Stopped 02/26/23 1223)  metoprolol tartrate (LOPRESSOR) tablet 25 mg (25 mg Oral Given 02/26/23 1120)  potassium chloride SA (KLOR-CON M) CR tablet 40 mEq (40 mEq Oral Given 02/26/23 1120)  magnesium sulfate IVPB 2 g 50 mL (0 g Intravenous Stopped 02/26/23 1223)  lactated ringers bolus 500 mL (0 mLs Intravenous Stopped 02/26/23 1225)  iohexol (OMNIPAQUE) 350 MG/ML injection 75 mL (75 mLs Intravenous Contrast Given 02/26/23 1245)    ED Course/ Medical Decision Making/ A&P                             Medical Decision Making Amount and/or  Complexity of Data Reviewed Labs: ordered. Radiology: ordered.  Risk Prescription drug management.   This patient presents to the ED for concern of dizziness and palpitations, this involves an extensive number of treatment options, and is a complaint that carries with it a high risk of complications and morbidity.  The differential diagnosis includes arrhythmia, metabolic derangements, polypharmacy, anemia   Co morbidities that complicate the patient evaluation  DM, HTN, atrial fibrillation   Additional history obtained:  Additional history obtained from N/A External records from outside source obtained and reviewed including EMR   Lab Tests:  I Ordered, and personally interpreted labs.  The pertinent results include: Low-normal potassium and magnesium; normal troponins x 2, normal hemoglobin, no leukocytosis   Imaging Studies ordered:  I ordered imaging studies including chest x-ray, CTA chest I independently visualized and  interpreted imaging which showed no acute findings I agree with the radiologist interpretation   Cardiac Monitoring: / EKG:  The patient was maintained on a cardiac monitor.  I personally viewed and interpreted the cardiac monitored which showed an underlying rhythm of: Sinus rhythm  Problem List / ED Course / Critical interventions / Medication management  Patient presenting for dizziness and palpitations.  He had similar symptoms 3 days ago, at which time he was in A-fib with RVR.  He does report some tachycardia this morning up to 150s on his Apple watch.  On arrival in the ED, heart rate is in the range of 110.  He is well-appearing on exam.  His breathing is unlabored.  EKG shows normal sinus rhythm with occasional PACs.  He was kept on bedside cardiac monitor.  Workup was initiated.  Patient was given IV fluids.  Magnesium and potassium levels were low-normal.  He was given supplements for electrolyte optimization.  Dose of metoprolol was given.  He remained in normal sinus rhythm while in the ED.  Although his heart rate did improve, it remained elevated in the range of 105.  When stood up, heart rate elevates further to 120.  He will experience some slight lightheadedness with standing.  He is otherwise asymptomatic.  Patient reports that his normal heart rate is in the range of 60s and 70s and was in his range yesterday.  Additional IV fluids and CTA of chest was ordered.  CT was negative for acute findings.  Patient had further improvement in his heart rate following additional IV fluids.  I suspect dehydration secondary to see current low-salt diabetic diet and HCTZ.  Given his Apple Watch readings at home, he may have gone in and out of A-fib this morning, however, he remained in sinus rhythm throughout his stay in the ED.  He had continuing improvement in symptoms.  He was able to ambulate to the bathroom without any further dizziness.  Patient would benefit from establishing care with  cardiology.  He is stable for discharge at this time. I ordered medication including IV fluids for hydration; potassium chloride and magnesium sulfate for electrolyte optimization; metoprolol for history of A-fib Reevaluation of the patient after these medicines showed that the patient improved I have reviewed the patients home medicines and have made adjustments as needed   Social Determinants of Health:  Has access to outpatient care         Final Clinical Impression(s) / ED Diagnoses Final diagnoses:  Dizziness  Palpitations    Rx / DC Orders ED Discharge Orders          Ordered    Ambulatory referral  to Cardiology       Comments: If you have not heard from the Cardiology office within the next 72 hours please call [phone].   02/26/23 1424              Gloris Manchester, MD 02/26/23 1425

## 2023-02-26 NOTE — Discharge Instructions (Signed)
Testing done today in the emergency department is reassuring.  Continue to stay hydrated by drinking plenty water and eating a healthy diet.  Continue current medications as prescribed.  A referral was sent to the cardiology office.  If you do not hear from them in the next couple days, call the number below.  Return to the emergency department for any new or worsening symptoms of concern.

## 2023-02-26 NOTE — ED Triage Notes (Signed)
Pt reports that he was here Friday morning and was in afib pt reports that he was cardioverted back into a normal rhythm. Pt reports that he is back in afib today. Pt reports feeling fluttering in his chest . Pt reports that his heart rate is between 40 in 152 per his apple watch. Pt reports dizzyness.

## 2023-02-28 ENCOUNTER — Inpatient Hospital Stay (HOSPITAL_COMMUNITY): Admission: RE | Admit: 2023-02-28 | Payer: Medicare HMO | Source: Ambulatory Visit

## 2023-02-28 ENCOUNTER — Encounter (HOSPITAL_COMMUNITY): Payer: Self-pay | Admitting: Internal Medicine

## 2023-02-28 ENCOUNTER — Ambulatory Visit (HOSPITAL_COMMUNITY)
Admission: RE | Admit: 2023-02-28 | Discharge: 2023-02-28 | Disposition: A | Discharge status: Home / Self Care | Payer: Medicare HMO | Source: Ambulatory Visit | Attending: Internal Medicine | Admitting: Internal Medicine

## 2023-02-28 VITALS — BP 142/84 | HR 97 | Ht 67.0 in | Wt 182.4 lb

## 2023-02-28 DIAGNOSIS — Z7901 Long term (current) use of anticoagulants: Secondary | ICD-10-CM | POA: Diagnosis not present

## 2023-02-28 DIAGNOSIS — Z7984 Long term (current) use of oral hypoglycemic drugs: Secondary | ICD-10-CM | POA: Insufficient documentation

## 2023-02-28 DIAGNOSIS — I110 Hypertensive heart disease with heart failure: Secondary | ICD-10-CM | POA: Insufficient documentation

## 2023-02-28 DIAGNOSIS — Z79899 Other long term (current) drug therapy: Secondary | ICD-10-CM | POA: Diagnosis not present

## 2023-02-28 DIAGNOSIS — D6869 Other thrombophilia: Secondary | ICD-10-CM | POA: Insufficient documentation

## 2023-02-28 DIAGNOSIS — I2510 Atherosclerotic heart disease of native coronary artery without angina pectoris: Secondary | ICD-10-CM | POA: Diagnosis not present

## 2023-02-28 DIAGNOSIS — I480 Paroxysmal atrial fibrillation: Secondary | ICD-10-CM | POA: Diagnosis not present

## 2023-02-28 DIAGNOSIS — I700 Atherosclerosis of aorta: Secondary | ICD-10-CM | POA: Diagnosis not present

## 2023-02-28 DIAGNOSIS — I4891 Unspecified atrial fibrillation: Secondary | ICD-10-CM

## 2023-02-28 MED ORDER — MULTAQ 400 MG PO TABS: 400.0000 mg | ORAL_TABLET | Freq: Two times a day (BID) | ORAL | 3 refills | Status: DC

## 2023-02-28 MED ORDER — METOPROLOL SUCCINATE ER 25 MG PO TB24: 25.0000 mg | ORAL_TABLET | Freq: Every day | ORAL | 2 refills | Status: AC

## 2023-02-28 MED ORDER — APIXABAN 5 MG PO TABS: 5.0000 mg | ORAL_TABLET | Freq: Two times a day (BID) | ORAL | 1 refills | Status: DC

## 2023-02-28 NOTE — Progress Notes (Signed)
Primary Care Physician: Charlane Ferretti, DO Primary Cardiologist: None Electrophysiologist: None     Referring Physician: [facility]     Kyle Gibson is a 72 y.o. male with a history of mild coronary and aortic atherosclerosis by imaging, HTN, T2DM, and paroxysmal atrial fibrillation who presents for consultation in the [facility]. Patient was seen in ED on 7/12 and 7/15 for palpitations found to be in Afib with RVR. He underwent emergent DCCV on 7/12 with successful conversion to NSR. He has been taking Toprol 25 mg daily. Patient is on Eliquis for a CHADS2VASC score of 4.  On evaluation today, he is currently in NSR. He has an Scientist, physiological but has not set it up to record heart rhythm. His watch shows HR 53-133. He notes that this morning he woke up in Afib and on the way to clinic was back in SR. He feels palpitations or quivering sensation. He states he has felt like he is going in and out of Afib. He does not have any bleeding issues with anticoagulation. He does not drink alcohol. He drinks 1 cup of coffee daily.  Today, he denies symptoms of chest pain, shortness of breath, orthopnea, PND, lower extremity edema, dizziness, presyncope, syncope, snoring, daytime somnolence, bleeding, or neurologic sequela. The patient is tolerating medications without difficulties and is otherwise without complaint today.    Atrial Fibrillation Risk Factors:  he does not have symptoms or diagnosis of sleep apnea.   he has a BMI of Body mass index is 28.57 kg/m.Marland Kitchen Filed Weights   02/28/23 1325  Weight: 82.7 kg    Current Outpatient Medications  Medication Sig Dispense Refill   amLODipine (NORVASC) 5 MG tablet Take 5 mg by mouth in the morning.     glimepiride (AMARYL) 2 MG tablet Take 2 mg by mouth daily with breakfast.     hydrochlorothiazide (HYDRODIURIL) 25 MG tablet Take 25 mg by mouth in the morning.     metFORMIN (GLUCOPHAGE) 500 MG tablet Take 1 tablet in  the morning and 2 tablets in the evening     olmesartan (BENICAR) 40 MG tablet Take 40 mg by mouth daily.     Semaglutide (OZEMPIC, 1 MG/DOSE, [place]) Inject 1 mg into the skin once a week.     simvastatin (ZOCOR) 20 MG tablet Take 20 mg by mouth every evening.     apixaban (ELIQUIS) 5 MG TABS tablet Take 1 tablet (5 mg total) by mouth 2 (two) times daily. 180 tablet 1   metoprolol succinate (TOPROL-XL) 25 MG 24 hr tablet Take 1 tablet (25 mg total) by mouth daily. 90 tablet 2   No current facility-administered medications for this encounter.    Atrial Fibrillation Management history:  Previous antiarrhythmic drugs: None  Previous cardioversions: 02/23/23 Previous ablations: None Anticoagulation history: Eliquis 5 mg BID   ROS- All systems are reviewed and negative except as per the HPI above.  Physical Exam: BP (!) 142/84   Pulse 97   Ht 5\' 7"  (1.702 m)   Wt 82.7 kg   BMI 28.57 kg/m   GEN: Well nourished, well developed in no acute distress NECK: No JVD; No carotid bruits CARDIAC: Regular rate and rhythm, no murmurs, rubs, gallops RESPIRATORY:  Clear to auscultation without rales, wheezing or rhonchi  ABDOMEN: Soft, non-tender, non-distended EXTREMITIES:  No edema; No deformity   EKG today demonstrates  Vent. rate 97 BPM PR interval 182 ms QRS duration 84 ms QT/QTcB 350/444 ms  P-R-T axes 57 79 -12 Normal sinus rhythm Cannot rule out Anterior infarct , age undetermined Abnormal ECG When compared with ECG of 26-Feb-2023 08:50, PREVIOUS ECG IS PRESENT  Echo N/A  ASSESSMENT & PLAN CHA2DS2-VASc Score = 4  The patient's score is based upon: CHF History: 0 HTN History: 1 Diabetes History: 1 Stroke History: 0 Vascular Disease History: 1 Age Score: 1 Gender Score: 0       ASSESSMENT AND PLAN: Paroxysmal Atrial Fibrillation (ICD10:  I48.0) The patient's CHA2DS2-VASc score is 4, indicating a 4.8% annual risk of stroke. Mild coronary artery and aortic atherosclerosis  by imaging; I will consider this a risk factor. S/p successful DCCV on 02/23/23. Patient states he is going in and out of Afib.   He is in NSR. He tells me he is going in and out of Afib and would like to begin treatment. He has not yet set up the ability on his Apple watch to record rhythm strips. I will place 1 week monitor for further evaluation. Will order echo. We discussed medication treatments and ablation procedure. We discussed Multaq, Tikosyn, and amiodarone as a bridge to ablation. I would be less inclined to start flecainide due to coronary atherosclerosis noted by imaging. After discussion, he contacted insurance for pricing and would like to begin Multaq. He would like to speak with EP doctor about ablation. Will plan for repeat ECG in 7-10 days.   Secondary Hypercoagulable State (ICD10:  D68.69) The patient is at significant risk for stroke/thromboembolism based upon his CHA2DS2-VASc Score of 4.  Continue Apixaban (Eliquis).  No missed doses.   Repeat ECG in 7-10 days.    Lake Bells, PA-C  [facility] [address] [place], [place] [address] [phone]

## 2023-02-28 NOTE — Patient Instructions (Signed)
 Start Multaq 400mg twice a day WITH FOOD 

## 2023-03-08 ENCOUNTER — Ambulatory Visit (HOSPITAL_COMMUNITY)
Admission: RE | Admit: 2023-03-08 | Discharge: 2023-03-08 | Disposition: A | Discharge status: Home / Self Care | Payer: Medicare HMO | Source: Ambulatory Visit | Attending: Physician Assistant | Admitting: Physician Assistant

## 2023-03-08 VITALS — HR 84

## 2023-03-08 DIAGNOSIS — I480 Paroxysmal atrial fibrillation: Secondary | ICD-10-CM | POA: Diagnosis not present

## 2023-03-08 DIAGNOSIS — Z5181 Encounter for therapeutic drug level monitoring: Secondary | ICD-10-CM

## 2023-03-08 NOTE — Progress Notes (Signed)
Patient returns for ECG after starting Multaq. ECG shows:  SR, 1st degree AV block Vent. rate 84 BPM PR interval 208 ms QRS duration 84 ms QT/QTcB 394/465 ms  Patient reports that he had one episode of tachypalpitations lasting < 3 minutes. He has had some diarrhea since starting the medication, he will continue to monitor. Follow up with Dr Elberta Fortis as scheduled.

## 2023-03-12 ENCOUNTER — Telehealth (HOSPITAL_COMMUNITY): Payer: Self-pay

## 2023-03-12 NOTE — Telephone Encounter (Signed)
Patient called he is having diarrhea. This started the day after starting Multaq. He is going every other day and the diarrhea will last almost 2 hours. He was told we can switch him to another antiarrhythmic medication. Patient would like to give this a little more time. He will try taking Pepto Bismol and Imodium to see if this will help with his diarrhea. Scheduled patient for appointment on August 29 th for follow up. Consulted with patient and he verbalized understanding.

## 2023-03-13 ENCOUNTER — Ambulatory Visit (HOSPITAL_COMMUNITY): Payer: Medicare HMO | Admitting: Physician Assistant

## 2023-03-14 ENCOUNTER — Ambulatory Visit (HOSPITAL_COMMUNITY)
Admission: RE | Admit: 2023-03-14 | Discharge: 2023-03-14 | Disposition: A | Discharge status: Home / Self Care | Payer: Medicare HMO | Source: Ambulatory Visit | Attending: Internal Medicine | Admitting: Internal Medicine

## 2023-03-14 DIAGNOSIS — I480 Paroxysmal atrial fibrillation: Secondary | ICD-10-CM

## 2023-03-14 LAB — ECHOCARDIOGRAM COMPLETE
Area-P 1/2: 3.31 cm2
Calc EF: 51.6 %
S' Lateral: 3.1 cm
Single Plane A2C EF: 53.2 %
Single Plane A4C EF: 51.7 %

## 2023-03-14 NOTE — Progress Notes (Signed)
Echocardiogram 2D Echocardiogram has been performed.  Augustine Radar 03/14/2023, 10:30 AM

## 2023-03-19 NOTE — Addendum Note (Signed)
Encounter addended by: Shona Simpson, RN on: 03/19/2023 2:54 PM  Actions taken: Imaging Exam ended

## 2023-04-11 ENCOUNTER — Ambulatory Visit: Payer: Medicare HMO | Admitting: Cardiology

## 2023-04-12 ENCOUNTER — Encounter (HOSPITAL_COMMUNITY): Payer: Self-pay

## 2023-04-12 ENCOUNTER — Ambulatory Visit (HOSPITAL_COMMUNITY): Payer: Medicare HMO | Admitting: Internal Medicine

## 2023-04-13 ENCOUNTER — Ambulatory Visit: Payer: Medicare HMO | Admitting: Cardiology

## 2023-04-13 ENCOUNTER — Encounter: Payer: Self-pay | Admitting: Cardiology

## 2023-04-13 VITALS — BP 156/80 | HR 82 | Ht 67.0 in | Wt 182.2 lb

## 2023-04-13 DIAGNOSIS — Z79899 Other long term (current) drug therapy: Secondary | ICD-10-CM

## 2023-04-13 DIAGNOSIS — I480 Paroxysmal atrial fibrillation: Secondary | ICD-10-CM

## 2023-04-13 LAB — TSH: TSH: 2.81 u[IU]/mL (ref 0.450–4.500)

## 2023-04-13 MED ORDER — AMIODARONE HCL 200 MG PO TABS: ORAL_TABLET | ORAL | 2 refills | Status: DC

## 2023-04-13 NOTE — Patient Instructions (Addendum)
Medication Instructions:  Your physician has recommended you make the following change in your medication:  STOP Multaq START Amiodarone --- start this on 9/3 - take 2 tablets (400 mg total) once daily for 2 weeks,  - take 1 tablet (200 mg total) once daily  *If you need a refill on your cardiac medications before your next appointment, please call your pharmacy*   Lab Work: Today: TSH  Pre procedure labs -- see procedure instruction letter:  BMP & CBC  If you have labs (blood work) drawn today and your tests are completely normal, you will receive your results only [id number] Message (if you have MyChart). Otherwise no news is good news. If you have any lab test that is abnormal and we need to change your treatment, we will call you to review the results.   Testing/Procedures: Your physician has requested that you have cardiac CT within 10 days PRIOR to your ablation. Cardiac computed tomography (CT) is a painless test that uses an x-ray machine to take clear, detailed pictures of your heart.  Please follow instruction below located under "other instructions". You will get a call from our office to schedule the date for this test.  Your physician has recommended that you have an ablation. Catheter ablation is a medical procedure used to treat some cardiac arrhythmias (irregular heartbeats). During catheter ablation, a long, thin, flexible tube is put into a blood vessel in your groin (upper thigh), or neck. This tube is called an ablation catheter. It is then guided to your heart through the blood vessel. Radio frequency waves destroy small areas of heart tissue where abnormal heartbeats may cause an arrhythmia to start.   You will be scheduled for ______.  The EP scheduler, April, will be in touch with procedure date &instructions.   Follow-Up: At [facility], you and your health needs are our priority.  As part of our continuing mission to provide you with exceptional heart care, we  have created designated Provider Care Teams.  These Care Teams include your primary Cardiologist (physician) and Advanced Practice Providers (APPs -  Physician Assistants and Nurse Practitioners) who all work together to provide you with the care you need, when you need it.   Your next appointment:   1 month(s) after your ablation  The format for your next appointment:   In Person  Provider:   AFib clinic   Thank you for choosing [facility]!!   Dory Horn, RN [phone]    Other Instructions   Amiodarone Tablets What is this medication? AMIODARONE (a MEE oh da rone) prevents and treats a fast or irregular heartbeat (arrhythmia). It works by slowing down overactive electric signals in the heart, which stabilizes your heart rhythm. It belongs to a group of medications called antiarrhythmics. This medicine may be used for other purposes; ask your health care provider or pharmacist if you have questions. COMMON BRAND NAME(S): Cordarone, Pacerone What should I tell my care team before I take this medication? They need to know if you have any of these conditions: Liver disease Lung disease Other heart problems Thyroid disease An unusual or allergic reaction to amiodarone, iodine, other medications, foods, dyes, or preservatives Pregnant or trying to get pregnant Breast-feeding How should I use this medication? Take this medication by mouth with water. Take it as directed on the prescription label at the same time every day. You can take it with or without food. You should always take it the same way. Keep taking it  unless your care team tells you to stop. A special MedGuide will be given to you by the pharmacist with each prescription and refill. Be sure to read this information carefully each time. Talk to your care team about the use of this medication in children. Special care may be needed. Overdosage: If you think you have taken too much of this medicine contact a  poison control center or emergency room at once. NOTE: This medicine is only for you. Do not share this medicine with others. What if I miss a dose? If you miss a dose, take it as soon as you can. If it is almost time for your next dose, take only that dose. Do not take double or extra doses. What may interact with this medication? Do not take this medication with any of the following: Abarelix Apomorphine Arsenic trioxide Certain antibiotics, such as erythromycin, gemifloxacin, levofloxacin, or pentamidine Certain medications for depression, such as amoxapine or tricyclic antidepressants Certain medications for fungal infections, such as fluconazole, itraconazole, ketoconazole, posaconazole, or voriconazole Certain medications for irregular heartbeat, such as disopyramide, dronedarone, ibutilide, propafenone, or sotalol Certain medications for malaria, such as chloroquine or halofantrine Cisapride Droperidol Haloperidol [place] Maprotiline Methadone Phenothiazines, such as chlorpromazine, mesoridazine, or thioridazine Pimozide Ranolazine Red yeast rice Vardenafil This medication may also interact with the following: Antivirals for HIV Certain medications for blood pressure, heart disease, irregular heartbeat Certain medications for cholesterol, such as atorvastatin, cerivastatin, lovastatin, or simvastatin Certain medications for hepatitis C, such as sofosbuvir and ledipasvir; sofosbuvir Certain medications for seizures, such as phenytoin Certain medications for thyroid problems Certain medications that prevent or treat blood clots, such as warfarin Cholestyramine Cimetidine Clopidogrel Cyclosporine Dextromethorphan Diuretics Dofetilide Fentanyl General anesthetics Grapefruit juice Lidocaine Loratadine Methotrexate Other medications that cause heart rhythm changes Procainamide Quinidine Rifabutin, rifampin, or rifapentine [employer]. Jadriel's  Wort Trazodone Ziprasidone This list may not describe all possible interactions. Give your health care provider a list of all the medicines, herbs, non-prescription drugs, or dietary supplements you use. Also tell them if you smoke, drink alcohol, or use illegal drugs. Some items may interact with your medicine. What should I watch for while using this medication? Your condition will be monitored closely when you first begin therapy. This medication is often started in a hospital or other monitored health care setting. Once you are on maintenance therapy, visit your care team for regular checks on your progress. Because your condition and use of this medication carry some risk, it is a good idea to carry an identification card, necklace, or bracelet with details of your condition, medications, and care team. This medication may affect your coordination, reaction time, or judgment. Do not drive or operate machinery until you know how this medication affects you. Sit up or stand slowly to reduce the risk of dizzy or fainting spells. Drinking alcohol with this medication can increase the risk of these side effects. This medication can make you more sensitive to the sun. Keep out of the sun. If you cannot avoid being in the sun, wear protective clothing and sunscreen. Do not use sun lamps, tanning beds, or tanning booths. You should have regular eye exams before and during treatment. Call your care team if you have blurred vision, see halos, or your eyes become sensitive to light. Your eyes may get dry. It may be helpful to use a lubricating eye solution or artificial tears solution. If you are going to have surgery or a procedure that requires contrast dyes, tell your care  team that you are taking this medication. What side effects may I notice from receiving this medication? Side effects that you should report to your care team as soon as possible: Allergic reactions--skin rash, itching, hives, swelling of  the face, lips, tongue, or throat Bluish-gray skin Change in vision such as blurry vision, seeing halos around lights, vision loss Heart failure--shortness of breath, swelling of the ankles, feet, or hands, sudden weight gain, unusual weakness or fatigue Heart rhythm changes--fast or irregular heartbeat, dizziness, feeling faint or lightheaded, chest pain, trouble breathing High thyroid levels (hyperthyroidism)--fast or irregular heartbeat, weight loss, excessive sweating or sensitivity to heat, tremors or shaking, anxiety, nervousness, irregular menstrual cycle or spotting Liver injury--right upper belly pain, loss of appetite, nausea, light-colored stool, dark yellow or brown urine, yellowing skin or eyes, unusual weakness or fatigue Low thyroid levels (hypothyroidism)--unusual weakness or fatigue, sensitivity to cold, constipation, hair loss, dry skin, weight gain, feelings of depression Lung injury--shortness of breath or trouble breathing, cough, spitting up blood, chest pain, fever Pain, tingling, or numbness in the hands or feet, muscle weakness, trouble walking, loss of balance or coordination Side effects that usually do not require medical attention (report to your care team if they continue or are bothersome): Nausea Vomiting This list may not describe all possible side effects. Call your doctor for medical advice about side effects. You may report side effects to FDA at 1-800-FDA-1088. Where should I keep my medication? Keep out of the reach of children and pets. Store at room temperature between 20 and 25 degrees C (68 and 77 degrees F). Protect from light. Keep container tightly closed. Throw away any unused medication after the expiration date. NOTE: This sheet is a summary. It may not cover all possible information. If you have questions about this medicine, talk to your doctor, pharmacist, or health care provider.  2024 Elsevier/Gold Standard (2022-02-17 00:00:00)   Cardiac  Ablation Cardiac ablation is a procedure to destroy (ablate) some heart tissue that is sending bad signals. These bad signals cause problems in heart rhythm. The heart has many areas that make these signals. If there are problems in these areas, they can make the heart beat in a way that is not normal. Destroying some tissues can help make the heart rhythm normal. Tell your doctor about: Any allergies you have. All medicines you are taking. These include vitamins, herbs, eye drops, creams, and over-the-counter medicines. Any problems you or family members have had with medicines that make you fall asleep (anesthetics). Any blood disorders you have. Any surgeries you have had. Any medical conditions you have, such as kidney failure. Whether you are pregnant or may be pregnant. What are the risks? This is a safe procedure. But problems may occur, including: Infection. Bruising and bleeding. Bleeding into the chest. Stroke or blood clots. Damage to nearby areas of your body. Allergies to medicines or dyes. The need for a pacemaker if the normal system is damaged. Failure of the procedure to treat the problem. What happens before the procedure? Medicines Ask your doctor about: Changing or stopping your normal medicines. This is important. Taking aspirin and ibuprofen. Do not take these medicines unless your doctor tells you to take them. Taking other medicines, vitamins, herbs, and supplements. General instructions Follow instructions from your doctor about what you cannot eat or drink. Plan to have someone take you home from the hospital or clinic. If you will be going home right after the procedure, plan to have someone with you  for 24 hours. Ask your doctor what steps will be taken to prevent infection. What happens during the procedure?  An IV tube will be put into one of your veins. You will be given a medicine to help you relax. The skin on your neck or groin will be numbed. A  cut (incision) will be made in your neck or groin. A needle will be put through your cut and into a large vein. A tube (catheter) will be put into the needle. The tube will be moved to your heart. Dye may be put through the tube. This helps your doctor see your heart. Small devices (electrodes) on the tube will send out signals. A type of energy will be used to destroy some heart tissue. The tube will be taken out. Pressure will be held on your cut. This helps stop bleeding. A bandage will be put over your cut. The exact procedure may vary among doctors and hospitals. What happens after the procedure? You will be watched until you leave the hospital or clinic. This includes checking your heart rate, breathing rate, oxygen, and blood pressure. Your cut will be watched for bleeding. You will need to lie still for a few hours. Do not drive for 24 hours or as long as your doctor tells you. Summary Cardiac ablation is a procedure to destroy some heart tissue. This is done to treat heart rhythm problems. Tell your doctor about any medical conditions you may have. Tell him or her about all medicines you are taking to treat them. This is a safe procedure. But problems may occur. These include infection, bruising, bleeding, and damage to nearby areas of your body. Follow what your doctor tells you about food and drink. You may also be told to change or stop some of your medicines. After the procedure, do not drive for 24 hours or as long as your doctor tells you. This information is not intended to replace advice given to you by your health care provider. Make sure you discuss any questions you have with your health care provider. Document Revised: 10/21/2021 Document Reviewed: 07/03/2019 Elsevier Patient Education  2023 Elsevier Inc.   Cardiac Ablation, Care After  This sheet gives you information about how to care for yourself after your procedure. Your health care provider may also give you more  specific instructions. If you have problems or questions, contact your health care provider. What can I expect after the procedure? After the procedure, it is common to have: Bruising around your puncture site. Tenderness around your puncture site. Skipped heartbeats. If you had an atrial fibrillation ablation, you may have atrial fibrillation during the first several months after your procedure.  Tiredness (fatigue).  Follow these instructions at home: Puncture site care  Follow instructions from your health care provider about how to take care of your puncture site. Make sure you: If present, leave stitches (sutures), skin glue, or adhesive strips in place. These skin closures may need to stay in place for up to 2 weeks. If adhesive strip edges start to loosen and curl up, you may trim the loose edges. Do not remove adhesive strips completely unless your health care provider tells you to do that. If a large square bandage is present, this may be removed 24 hours after surgery.  Check your puncture site every day for signs of infection. Check for: Redness, swelling, or pain. Fluid or blood. If your puncture site starts to bleed, lie down on your back, apply firm pressure to  the area, and contact your health care provider. Warmth. Pus or a bad smell. A pea or small marble sized lump at the site is normal and can take up to three months to resolve.  Driving Do not drive for at least 4 days after your procedure or however long your health care provider recommends. (Do not resume driving if you have previously been instructed not to drive for other health reasons.) Do not drive or use heavy machinery while taking prescription pain medicine. Activity Avoid activities that take a lot of effort for at least 7 days after your procedure. Do not lift anything that is heavier than 5 lb (4.5 kg) for one week.  No sexual activity for 1 week.  Return to your normal activities as told by your health care  provider. Ask your health care provider what activities are safe for you. General instructions Take over-the-counter and prescription medicines only as told by your health care provider. Do not use any products that contain nicotine or tobacco, such as cigarettes and e-cigarettes. If you need help quitting, ask your health care provider. You may shower after 24 hours, but Do not take baths, swim, or use a hot tub for 1 week.  Do not drink alcohol for 24 hours after your procedure. Keep all follow-up visits as told by your health care provider. This is important. Contact a health care provider if: You have redness, mild swelling, or pain around your puncture site. You have fluid or blood coming from your puncture site that stops after applying firm pressure to the area. Your puncture site feels warm to the touch. You have pus or a bad smell coming from your puncture site. You have a fever. You have chest pain or discomfort that spreads to your neck, jaw, or arm. You have chest pain that is worse with lying on your back or taking a deep breath. You are sweating a lot. You feel nauseous. You have a fast or irregular heartbeat. You have shortness of breath. You are dizzy or light-headed and feel the need to lie down. You have pain or numbness in the arm or leg closest to your puncture site. Get help right away if: Your puncture site suddenly swells. Your puncture site is bleeding and the bleeding does not stop after applying firm pressure to the area. These symptoms may represent a serious problem that is an emergency. Do not wait to see if the symptoms will go away. Get medical help right away. Call your local emergency services (911 in the [place] Do not drive yourself to the hospital. Summary After the procedure, it is normal to have bruising and tenderness at the puncture site in your groin, neck, or forearm. Check your puncture site every day for signs of infection. Get help right away if  your puncture site is bleeding and the bleeding does not stop after applying firm pressure to the area. This is a medical emergency. This information is not intended to replace advice given to you by your health care provider. Make sure you discuss any questions you have with your health care provider.

## 2023-04-13 NOTE — Progress Notes (Signed)
Electrophysiology Office Note:   Date:  04/13/2023  ID:  Kyle Gibson, DOB [birth year], MRN [id number]  Primary Cardiologist: None Electrophysiologist: Sanah Kraska Jorja Loa, MD      History of Present Illness:   Kyle Gibson is a 72 y.o. male with h/o coronary calcium, hypertension, type 2 diabetes, atrial fibrillation seen today for  for Electrophysiology evaluation of atrial fibrillation at the request of [place].    He presented to the emergency room 02/23/2023 with palpitations and found to be in atrial fibrillation.  He had a cardioversion at the time.  He was started on Eliquis.  He had follow-up in A-fib clinic and was started on Multaq for rhythm control and with plans for potential ablation.  Since starting Multaq, he has noted increased diarrhea as well as short episodes of palpitations.  Aside from that, he has been doing well.  He exercises quite a bit using the elliptical and exercise bike.  Review of systems complete and found to be negative unless listed in HPI.      EP Information / Studies Reviewed:    EKG is ordered today. Personal review as below.  EKG Interpretation Date/Time:  Friday April 13 2023 08:57:18 EDT Ventricular Rate:  82 PR Interval:  200 QRS Duration:  88 QT Interval:  390 QTC Calculation: 455 R Axis:   57  Text Interpretation: Normal sinus rhythm Normal ECG When compared with ECG of 08-Mar-2023 13:29, No significant change was found Confirmed by Kordelia Severin ([id number]) on 04/13/2023 9:01:41 AM   ICD Interrogation-  reviewed in detail today,  See PACEART report.   Risk Assessment/Calculations:    CHA2DS2-VASc Score = 4   This indicates a 4.8% annual risk of stroke. The patient's score is based upon: CHF History: 0 HTN History: 1 Diabetes History: 1 Stroke History: 0 Vascular Disease History: 1 Age Score: 1 Gender Score: 0            Physical Exam:   VS:  BP (!) 156/80 Comment: recheck  Pulse 82   Ht 5\' 7"  (1.702 m)   Wt 182 lb 3.2  oz (82.6 kg)   SpO2 98%   BMI 28.54 kg/m    Wt Readings from Last 3 Encounters:  04/13/23 182 lb 3.2 oz (82.6 kg)  02/28/23 182 lb 6.4 oz (82.7 kg)  02/26/23 180 lb (81.6 kg)     GEN: Well nourished, well developed in no acute distress NECK: No JVD; No carotid bruits CARDIAC: Regular rate and rhythm, no murmurs, rubs, gallops RESPIRATORY:  Clear to auscultation without rales, wheezing or rhonchi  ABDOMEN: Soft, non-tender, non-distended EXTREMITIES:  No edema; No deformity   ASSESSMENT AND PLAN:    1.  Paroxysmal atrial fibrillation: Currently on Eliquis and Multaq.  He is having diarrhea on Multaq.  Laekyn Rayos start amiodarone.  He would like to avoid long-term medications.  Marranda Arakelian plan for ablation.  Risk and benefits have been discussed.  He understands the risks and is agreed to the procedure.  Risk, benefits, and alternatives to EP study and radiofrequency/pulse field ablation for afib were also discussed in detail today. These risks include but are not limited to stroke, bleeding, vascular damage, tamponade, perforation, damage to the esophagus, lungs, and other structures, pulmonary vein stenosis, worsening renal function, and death. The patient understands these risk and wishes to proceed.  We Hilario Robarts therefore proceed with catheter ablation at the next available time.  Carto, ICE, anesthesia are requested for the procedure.  Daryn Pisani also obtain CT PV  protocol prior to the procedure to exclude LAA thrombus and further evaluate atrial anatomy.  2.  Second hypercoagulable state: Currently on Eliquis for atrial fibrillation  Disposition:   Follow up with Dr. Elberta Fortis as usual post procedure   Signed, Andrik Sandt Jorja Loa, MD

## 2023-04-22 NOTE — Progress Notes (Unsigned)
Cardiology Office Note:    Date:  04/23/2023   ID:  Kyle Gibson, DOB 1951/03/30, MRN [id number]  PCP:  Charlane Ferretti, DO  Cardiologist:  None  Electrophysiologist:  Will Jorja Loa, MD   Referring MD: Charlane Ferretti, DO   Chief Complaint  Patient presents with   Atrial Fibrillation    History of Present Illness:    Kyle Gibson is a 72 y.o. male with a hx of T2DM, hypertension, paroxysmal atrial fibrillation who is referred by Dr. Cherlynn June for evaluation of atrial fibrillation.  He presented to ED twice in July 2024 with palpitations, found to be in A-fib with RVR.  Underwent cardioversion on 02/23/2023 with successful conversion to sinus rhythm.  He was started on Eliquis and Toprol-XL.  He was seen in A-fib clinic 02/28/2023 and started on Multaq and referred to EP to consider ablation.  He was seen by Dr. Elberta Fortis on 04/13/2023 and reported diarrhea on Multaq, switched to amiodarone, with plans to proceed with ablation.  Echo 03/14/2023 showed EF 50 to 55%, normal RV function, no significant valvular disease, normal diastolic function.  Zio patch x 7 days 02/2023 showed 107 episodes of SVT, longest lasting 3 minutes 21 seconds with average rate 131 bpm, occasional PACs (1.4% of beats).  He reports he had diarrhea on Multaq but is much better on amiodarone.  States no A-fib since starting amiodarone.  Feels palpitations when he is in A-fib.  He is taking Eliquis, denies any bleeding issues.  He walks 30 minutes all times per week, denies any exertional chest pain or dyspnea.  Reports occasional sharp chest pain at rest.  Denies any lightheadedness or syncope or lower extremity edema.  He reports BP 120s over 70s when checks at home.  Never smoked.  Father had CHF.  Past Medical History:  Diagnosis Date   Diabetes mellitus without complication (HCC)    Hypertension     No past surgical history on file.  Current Medications: Current Meds  Medication Sig   acetaminophen (TYLENOL) 500 MG  tablet Take 500 mg by mouth as needed.   amiodarone (PACERONE) 200 MG tablet Take 2 tablets (400 mg total) once daily for 2 weeks, then take 1 tablet (200 mg total) once daily   amLODipine (NORVASC) 10 MG tablet Take 10 mg by mouth in the morning.   famotidine (PEPCID) 20 MG tablet Take 20 mg by mouth as needed for heartburn or indigestion.   hydrochlorothiazide (HYDRODIURIL) 25 MG tablet Take 25 mg by mouth in the morning.   metFORMIN (GLUCOPHAGE) 500 MG tablet Take 2 tablets in the morning and 2 tablets in the evening   metoprolol succinate (TOPROL-XL) 25 MG 24 hr tablet Take 1 tablet (25 mg total) by mouth daily.   Multiple Vitamin (MULTIVITAMIN) capsule Take 1 capsule by mouth daily.   olmesartan (BENICAR) 40 MG tablet Take 40 mg by mouth daily.   rosuvastatin (CRESTOR) 10 MG tablet 1 tablet Orally Once a day for 30 days   Semaglutide (OZEMPIC, 1 MG/DOSE, [place]) Inject 1 mg into the skin once a week.     Allergies:   Invokana [canagliflozin] and Empagliflozin   Social History   Socioeconomic History   Marital status: Married    Spouse name: Not on file   Number of children: Not on file   Years of education: Not on file   Highest education level: Not on file  Occupational History   Not on file  Tobacco Use   Smoking status: Never  Smokeless tobacco: Never  Vaping Use   Vaping status: Never Used  Substance and Sexual Activity   Alcohol use: Never   Drug use: Never   Sexual activity: Not on file  Other Topics Concern   Not on file  Social History Narrative   Not on file   Social Determinants of Health   Financial Resource Strain: Not on file  Food Insecurity: Not on file  Transportation Needs: Not on file  Physical Activity: Not on file  Stress: Not on file  Social Connections: Not on file     Family History: Father had CHF.  ROS:   Please see the history of present illness.     All other systems reviewed and are negative.  EKGs/Labs/Other Studies Reviewed:     The following studies were reviewed today:   EKG:   04/23/2023: Sinus rhythm, first-degree AV block, rate 79, no ST abnormalities  Recent Labs: 02/23/2023: ALT 17 02/26/2023: BUN 14; Creatinine, Ser 0.99; Hemoglobin 16.3; Magnesium 1.8; Platelets 208; Potassium 3.7; Sodium 137 04/13/2023: TSH 2.810  Recent Lipid Panel No results found for: "CHOL", "TRIG", "HDL", "CHOLHDL", "VLDL", "LDLCALC", "LDLDIRECT"  Physical Exam:    VS:  BP (!) 140/90   Pulse 79   Ht 5\' 7"  (1.702 m)   Wt 183 lb 6.4 oz (83.2 kg)   SpO2 97%   BMI 28.72 kg/m     Wt Readings from Last 3 Encounters:  04/23/23 183 lb 6.4 oz (83.2 kg)  04/13/23 182 lb 3.2 oz (82.6 kg)  02/28/23 182 lb 6.4 oz (82.7 kg)     GEN:  Well nourished, well developed in no acute distress HEENT: Normal NECK: No JVD; No carotid bruits LYMPHATICS: No lymphadenopathy CARDIAC: RRR, no murmurs, rubs, gallops RESPIRATORY:  Clear to auscultation without rales, wheezing or rhonchi  ABDOMEN: Soft, non-tender, non-distended MUSCULOSKELETAL:  No edema; No deformity  SKIN: Warm and dry NEUROLOGIC:  Alert and oriented x 3 PSYCHIATRIC:  Normal affect   ASSESSMENT:    1. Paroxysmal atrial fibrillation (HCC)   2. Essential hypertension   3. Hyperlipidemia, unspecified hyperlipidemia type   4. Daytime somnolence    PLAN:    Atrial fibrillation: He presented to ED twice in July 2024 with palpitations, found to be in A-fib with RVR.  Underwent cardioversion on 02/23/2023 with successful conversion to sinus rhythm.  He was started on Eliquis and Toprol-XL.  He was seen in A-fib clinic 02/28/2023 and started on Multaq and referred to EP to consider ablation.  He was seen by Dr. Elberta Fortis on 04/13/2023 and reported diarrhea on Multaq, switched to amiodarone, with plans to proceed with ablation.  Echo 03/14/2023 showed EF 50 to 55%, normal RV function, no significant valvular disease, normal diastolic function.  Zio patch x 7 days 02/2023 showed 107 episodes  of SVT, longest lasting 3 minutes 21 seconds with average rate 131 bpm, occasional PACs (1.4% of beats). -Continue Eliquis, Toprol-XL, and amiodarone.  Planning ablation with Dr. Elberta Fortis -Check sleep study  Hypertension: On hydrochlorothiazide 25 mg, Toprol-XL 25 mg, amlodipine 10 mg daily, olmesartan 40 mg daily.  Elevated in clinic but reports under good control at home, will monitor  Hyperlipidemia: On rosuvastatin 10 mg daily.  Check lipid panel.  Goal LDL less than 70 given coronary calcification seen on CTPA 02/2023  Daytime somnolence/snoring: Check sleep study.  STOP-BANG 5  T2DM: On metformin and Ozempic  RTC in 6 months  Medication Adjustments/Labs and Tests Ordered: Current medicines are reviewed at length  with the patient today.  Concerns regarding medicines are outlined above.  Orders Placed This Encounter  Procedures   Basic metabolic panel   CBC   Magnesium   Lipid panel   EKG 12-Lead   [facility] Sleep Study   No orders of the defined types were placed in this encounter.   Patient Instructions  Medication Instructions:  No changes *If you need a refill on your cardiac medications before your next appointment, please call your pharmacy*   Lab Work: BMET, CBC, Lipid Panel, Magnesium Level. To Be Done Today. If you have labs (blood work) drawn today and your tests are completely normal, you will receive your results only by: MyChart Message (if you have MyChart) OR A paper copy in the mail If you have any lab test that is abnormal or we need to change your treatment, we will call you to review the results.   Testing/Procedures: WatchPAT?  Is a FDA cleared portable home sleep study test that uses a watch and 3 points of contact to monitor 7 different channels, including your heart rate, oxygen saturations, body position, snoring, and chest motion.  The study is easy to use from the comfort of your own home and accurately detect sleep apnea.  Before bed, you attach  the chest sensor, attached the sleep apnea bracelet to your nondominant hand, and attach the finger probe.  After the study, the raw data is downloaded from the watch and scored for apnea events.   For Gibson information: [email or web address]  Patient Testing Instructions:  Do not put battery into the device until bedtime when you are ready to begin the test. Please call the support number if you need assistance after following the instructions below: 24 hour support line- [phone] or [facility] support at [phone] (option 2)  Download the IntelWatchPAT One" app through the google play store or App Store  Be sure to turn on or enable access to bluetooth in settlings on your smartphone/ device  Make sure no other bluetooth devices are on and within the vicinity of your smartphone/ device and WatchPAT watch during testing.  Make sure to leave your smart phone/ device plugged in and charging all night.  When ready for bed:  Follow the instructions step by step in the WatchPAT One App to activate the testing device. For additional instructions, including video instruction, visit the WatchPAT One video on Youtube. You can search for WatchPat One within Youtube (video is 4 minutes and 18 seconds) or enter: https://youtube/[email or web address]?[email or web address]_vbiwxE Please note: You will be prompted to enter a Pin to connect via bluetooth when starting the test. The PIN will be assigned to you when you receive the test.  The device is disposable, but it recommended that you retain the device until you receive a call letting you know the study has been received and the results have been interpreted.  We will let you know if the study did not transmit to [place] properly after the test is completed. You do not need to call us to confirm the receipt of the test.  Please complete the test within 48 hours of receiving PIN.   Frequently Asked Questions:  What is Watch Dennie Bible one?  A single use fully disposable home  sleep apnea testing device and will not need to be returned after completion.  What are the requirements to use WatchPAT one?  The be able to have a successful watchpat one sleep study, you should have your Watch pat one device, your smart phone,  watch pat one app, your PIN number and Internet access What type of phone do I need?  You should have a smart phone that uses Android 5.1 and above or any Iphone with IOS 10 and above How can I download the WatchPAT one app?  Based on your device type search for WatchPAT one app either in google play for android devices or APP store for Iphone's Where will I get my PIN for the study?  Your PIN will be provided by your physician's office. It is used for authentication and if you lose/forget your PIN, please reach out to your providers office.  I do not have Internet at home. Can I do WatchPAT one study?  WatchPAT One needs Internet connection throughout the night to be able to transmit the sleep data. You can use your home/local internet or your cellular's data package. However, it is always recommended to use home/local Internet. It is estimated that between 20MB-30MB will be used with each study.However, the application will be looking for space in the phone to start the study.  What happens if I lose internet or bluetooth connection?  During the internet disconnection, your phone will not be able to transmit the sleep data. All the data, will be stored in your phone. As soon as the internet connection is back on, the phone will being sending the sleep data. During the bluetooth disconnection, WatchPAT one will not be able to to send the sleep data to your phone. Data will be kept in the [facility] one until two devices have bluetooth connection back on. As soon as the connection is back on, WatchPAT one will send the sleep data to the phone.  How long do I need to wear the WatchPAT one?  After you start the study, you should wear the device at least 6  hours.  How far should I keep my phone from the device?  During the night, your phone should be within 15 feet.  What happens if I leave the room for restroom or other reasons?  Leaving the room for any reason will not cause any problem. As soon as your get back to the room, both devices will reconnect and will continue to send the sleep data. Can I use my phone during the sleep study?  Yes, you can use your phone as usual during the study. But it is recommended to put your watchpat one on when you are ready to go to bed.  How will I get my study results?  A soon as you completed your study, your sleep data will be sent to the provider. They will then share the results with you when they are ready.      Follow-Up: At [facility], you and your health needs are our priority.  As part of our continuing mission to provide you with exceptional heart care, we have created designated Provider Care Teams.  These Care Teams include your primary Cardiologist (physician) and Advanced Practice Providers (APPs -  Physician Assistants and Nurse Practitioners) who all work together to provide you with the care you need, when you need it.  We recommend signing up for the patient portal called "MyChart".  Sign up information is provided on this After Visit Summary.  MyChart is used to connect with patients for Virtual Visits (Telemedicine).  Patients are able to view lab/test results, encounter notes, upcoming appointments, etc.  Non-urgent messages can be sent to your provider as well.   To learn Gibson about  what you can do with MyChart, go to [email or web address].    Your next appointment:   6 month(s)  Provider:   Epifanio Lesches, MD     Signed, Little Ishikawa, MD  04/23/2023 1:20 PM    [facility]

## 2023-04-23 ENCOUNTER — Ambulatory Visit: Payer: Medicare HMO | Attending: Cardiology | Admitting: Cardiology

## 2023-04-23 ENCOUNTER — Encounter: Payer: Self-pay | Admitting: Cardiology

## 2023-04-23 VITALS — BP 140/90 | HR 79 | Ht 67.0 in | Wt 183.4 lb

## 2023-04-23 DIAGNOSIS — E785 Hyperlipidemia, unspecified: Secondary | ICD-10-CM | POA: Diagnosis not present

## 2023-04-23 DIAGNOSIS — I10 Essential (primary) hypertension: Secondary | ICD-10-CM | POA: Diagnosis not present

## 2023-04-23 DIAGNOSIS — I480 Paroxysmal atrial fibrillation: Secondary | ICD-10-CM

## 2023-04-23 DIAGNOSIS — R400 Somnolence: Secondary | ICD-10-CM | POA: Diagnosis not present

## 2023-04-23 NOTE — Patient Instructions (Signed)
Medication Instructions:  No changes *If you need a refill on your cardiac medications before your next appointment, please call your pharmacy*   Lab Work: BMET, CBC, Lipid Panel, Magnesium Level. To Be Done Today. If you have labs (blood work) drawn today and your tests are completely normal, you will receive your results only by: MyChart Message (if you have MyChart) OR A paper copy in the mail If you have any lab test that is abnormal or we need to change your treatment, we will call you to review the results.   Testing/Procedures: WatchPAT?  Is a FDA cleared portable home sleep study test that uses a watch and 3 points of contact to monitor 7 different channels, including your heart rate, oxygen saturations, body position, snoring, and chest motion.  The study is easy to use from the comfort of your own home and accurately detect sleep apnea.  Before bed, you attach the chest sensor, attached the sleep apnea bracelet to your nondominant hand, and attach the finger probe.  After the study, the raw data is downloaded from the watch and scored for apnea events.   For more information: [email or web address]  Patient Testing Instructions:  Do not put battery into the device until bedtime when you are ready to begin the test. Please call the support number if you need assistance after following the instructions below: 24 hour support line- [phone] or ITAMAR support at [phone] (option 2)  Download the IntelWatchPAT One" app through the google play store or App Store  Be sure to turn on or enable access to bluetooth in settlings on your smartphone/ device  Make sure no other bluetooth devices are on and within the vicinity of your smartphone/ device and WatchPAT watch during testing.  Make sure to leave your smart phone/ device plugged in and charging all night.  When ready for bed:  Follow the instructions step by step in the WatchPAT One App to activate the  testing device. For additional instructions, including video instruction, visit the WatchPAT One video on Youtube. You can search for WatchPat One within Youtube (video is 4 minutes and 18 seconds) or enter: https://youtube/watch?[email or web address]_vbiwxE Please note: You will be prompted to enter a Pin to connect via bluetooth when starting the test. The PIN will be assigned to you when you receive the test.  The device is disposable, but it recommended that you retain the device until you receive a call letting you know the study has been received and the results have been interpreted.  We will let you know if the study did not transmit to [place] properly after the test is completed. You do not need to call us to confirm the receipt of the test.  Please complete the test within 48 hours of receiving PIN.   Frequently Asked Questions:  What is Watch Dennie Bible one?  A single use fully disposable home sleep apnea testing device and will not need to be returned after completion.  What are the requirements to use WatchPAT one?  The be able to have a successful watchpat one sleep study, you should have your Watch pat one device, your smart phone, watch pat one app, your PIN number and Internet access What type of phone do I need?  You should have a smart phone that uses Android 5.1 and above or any Iphone with IOS 10 and above How can I download the WatchPAT one app?  Based on your device type search for WatchPAT one app either in google play  for android devices or APP store for Iphone's Where will I get my PIN for the study?  Your PIN will be provided by your physician's office. It is used for authentication and if you lose/forget your PIN, please reach out to your providers office.  I do not have Internet at home. Can I do WatchPAT one study?  WatchPAT One needs Internet connection throughout the night to be able to transmit the sleep data. You can use your home/local internet or your cellular's data package. However, it  is always recommended to use home/local Internet. It is estimated that between 20MB-30MB will be used with each study.However, the application will be looking for space in the phone to start the study.  What happens if I lose internet or bluetooth connection?  During the internet disconnection, your phone will not be able to transmit the sleep data. All the data, will be stored in your phone. As soon as the internet connection is back on, the phone will being sending the sleep data. During the bluetooth disconnection, WatchPAT one will not be able to to send the sleep data to your phone. Data will be kept in the [facility] one until two devices have bluetooth connection back on. As soon as the connection is back on, WatchPAT one will send the sleep data to the phone.  How long do I need to wear the WatchPAT one?  After you start the study, you should wear the device at least 6 hours.  How far should I keep my phone from the device?  During the night, your phone should be within 15 feet.  What happens if I leave the room for restroom or other reasons?  Leaving the room for any reason will not cause any problem. As soon as your get back to the room, both devices will reconnect and will continue to send the sleep data. Can I use my phone during the sleep study?  Yes, you can use your phone as usual during the study. But it is recommended to put your watchpat one on when you are ready to go to bed.  How will I get my study results?  A soon as you completed your study, your sleep data will be sent to the provider. They will then share the results with you when they are ready.      Follow-Up: At [facility], you and your health needs are our priority.  As part of our continuing mission to provide you with exceptional heart care, we have created designated Provider Care Teams.  These Care Teams include your primary Cardiologist (physician) and Advanced Practice Providers (APPs -  Physician  Assistants and Nurse Practitioners) who all work together to provide you with the care you need, when you need it.  We recommend signing up for the patient portal called "MyChart".  Sign up information is provided on this After Visit Summary.  MyChart is used to connect with patients for Virtual Visits (Telemedicine).  Patients are able to view lab/test results, encounter notes, upcoming appointments, etc.  Non-urgent messages can be sent to your provider as well.   To learn more about what you can do with MyChart, go to [email or web address].    Your next appointment:   6 month(s)  Provider:   Epifanio Lesches, MD

## 2023-04-24 ENCOUNTER — Other Ambulatory Visit: Payer: Self-pay

## 2023-04-24 LAB — BASIC METABOLIC PANEL
BUN/Creatinine Ratio: 18 (ref 10–24)
BUN: 16 mg/dL (ref 8–27)
CO2: 28 mmol/L (ref 20–29)
Calcium: 9.9 mg/dL (ref 8.6–10.2)
Chloride: 99 mmol/L (ref 96–106)
Creatinine, Ser: 0.87 mg/dL (ref 0.76–1.27)
Glucose: 218 mg/dL — ABNORMAL HIGH (ref 70–99)
Potassium: 4 mmol/L (ref 3.5–5.2)
Sodium: 138 mmol/L (ref 134–144)
eGFR: 92 mL/min/{1.73_m2} (ref >59)

## 2023-04-24 LAB — CBC
Hematocrit: 46.4 % (ref 37.5–51.0)
Hemoglobin: 15 g/dL (ref 13.0–17.7)
MCH: 29.9 pg (ref 26.6–33.0)
MCHC: 32.3 g/dL (ref 31.5–35.7)
MCV: 92 fL (ref 79–97)
Platelets: 204 10*3/uL (ref 150–450)
RBC: 5.02 x10E6/uL (ref 4.14–5.80)
RDW: 12.9 % (ref 11.6–15.4)
WBC: 10.2 10*3/uL (ref 3.4–10.8)

## 2023-04-24 LAB — LIPID PANEL
Chol/HDL Ratio: 2.6 ratio (ref 0.0–5.0)
Cholesterol, Total: 134 mg/dL (ref 100–199)
HDL: 52 mg/dL (ref >39)
LDL Chol Calc (NIH): 62 mg/dL (ref 0–99)
Triglycerides: 111 mg/dL (ref 0–149)
VLDL Cholesterol Cal: 20 mg/dL (ref 5–40)

## 2023-04-24 LAB — MAGNESIUM: Magnesium: 1.7 mg/dL (ref 1.6–2.3)

## 2023-04-24 MED ORDER — MAGNESIUM OXIDE -MG SUPPLEMENT 400 MG PO CAPS: ORAL_CAPSULE | ORAL | 3 refills | Status: DC

## 2023-04-26 NOTE — Progress Notes (Signed)
Patient agreement reviewed and signed on 04/23/2023.  WatchPAT issued to patient on 04/23/2023 by Brunetta Genera, CMA. Patient aware to not open the WatchPAT box until contacted with the activation PIN. Patient profile initialized in CloudPAT on 04/26/2023 by Brunetta Genera, CMA. Device serial number: [id number]

## 2023-05-01 ENCOUNTER — Encounter: Payer: Self-pay | Admitting: Cardiology

## 2023-05-02 ENCOUNTER — Telehealth: Payer: Self-pay

## 2023-05-02 DIAGNOSIS — I480 Paroxysmal atrial fibrillation: Secondary | ICD-10-CM

## 2023-05-02 NOTE — Telephone Encounter (Signed)
Pt is scheduled for Afib Ablation with Dr. Elberta Fortis on 10/21 at 10:30 AM.  He will come to [facility] for labs on 10/4. CT is scheduled on 10/11 at 9:00 AM.  Instruction letters will be sent via MyChart.

## 2023-05-04 ENCOUNTER — Telehealth: Payer: Self-pay

## 2023-05-04 NOTE — Telephone Encounter (Signed)
Ordering provider: Bjorn Pippin Associated diagnoses: Paroxysmal atrial fibrillation, Essential (primary) hypertension, Hyperlipidemia unspecified, Somnolence WatchPAT PA obtained on 05/04/2023 by Brunetta Genera, CMA. Authorization: Yes; tracking ID  [id number] Patient notified of PIN (1234) on 05/04/2023 via Notification Method: phone.  Phone note routed to covering staff for follow-up.  I

## 2023-05-14 ENCOUNTER — Encounter (INDEPENDENT_AMBULATORY_CARE_PROVIDER_SITE_OTHER): Payer: Self-pay | Admitting: Cardiology

## 2023-05-14 DIAGNOSIS — G4733 Obstructive sleep apnea (adult) (pediatric): Secondary | ICD-10-CM

## 2023-05-14 DIAGNOSIS — G4719 Other hypersomnia: Secondary | ICD-10-CM

## 2023-05-15 ENCOUNTER — Ambulatory Visit: Payer: Medicare HMO | Attending: Cardiology

## 2023-05-15 DIAGNOSIS — I480 Paroxysmal atrial fibrillation: Secondary | ICD-10-CM

## 2023-05-15 DIAGNOSIS — I10 Essential (primary) hypertension: Secondary | ICD-10-CM

## 2023-05-15 DIAGNOSIS — R400 Somnolence: Secondary | ICD-10-CM

## 2023-05-15 DIAGNOSIS — E785 Hyperlipidemia, unspecified: Secondary | ICD-10-CM

## 2023-05-15 NOTE — Procedures (Signed)
Patient Information Study Date: 05/14/2023 Patient Name: Kyle Gibson Patient ID: [id number] Birth Date: Feb 15, 1951 Age: 72 Gender: Male BMI: 29.4 (W=183 lb, H=5' 6'') Referring Physician: Epifanio Lesches, MD  TEST DESCRIPTION: Home sleep apnea testing was completed using the WatchPat, a Type 1 device, utilizing peripheral arterial tonometry (PAT), chest movement, actigraphy, pulse oximetry, pulse rate, body position and snore. AHI was calculated with apnea and hypopnea using valid sleep time as the denominator. RDI includes apneas, hypopneas, and RERAs. The data acquired and the scoring of sleep and all associated events were performed in accordance with the recommended standards and specifications as outlined in the AASM Manual for the Scoring of Sleep and Associated Events 2.2.0 (2015).   FINDINGS:   1. Mild Obstructive Sleep Apnea with AHI 6.3/hr. Most events occurred in the supine position.  2. No Central Sleep Apnea with pAHIc 0.4/hr.   3. Oxygen desaturations as low as 89%.   4. Minimal snoring was present. O2 sats were < 88% for 0 min.   5. Total sleep time was 7 hrs and 47 min.   6. 19.3% of total sleep time was spent in REM sleep.   7. Shortened sleep onset latency at 5 min.   8. Shortened REM sleep onset latency at 71 min.   9. Total awakenings were 4.  10. 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  Clinical correlation of these findings is necessary.  The decision to treat obstructive sleep apnea (OSA) is usually based on the presence of apnea symptoms or the presence of associated medical conditions such as Hypertension, Congestive Heart Failure, Atrial Fibrillation or Obesity.  The most common symptoms of OSA are snoring, gasping for breath while sleeping, daytime sleepiness and fatigue.   2.  Initiating apnea therapy is recommended given the presence of symptoms and/or associated conditions. Recommend  proceeding with one of the following:     a.  Auto-CPAP therapy with a pressure range of 5-20cm H2O.     b.  An oral appliance (OA) that can be obtained from certain dentists with expertise in sleep medicine.  These are primarily of use in non-obese patients with mild and moderate disease.     c.  An ENT consultation which may be useful to look for specific causes of obstruction and possible treatment options.     d.  If patient is intolerant to PAP therapy, consider referral to ENT for evaluation for hypoglossal nerve stimulator.   3.  Close follow-up is necessary to ensure success with CPAP or oral appliance therapy for maximum benefit.  4.  A follow-up oximetry study on CPAP is recommended to assess the adequacy of therapy and determine the need for supplemental oxygen or the potential need for Bi-level therapy.  An arterial blood gas to determine the adequacy of baseline ventilation and oxygenation should also be considered.  5.  Healthy sleep recommendations include:  adequate nightly sleep (normal 7-9 hrs/night), avoidance of caffeine after noon and alcohol near bedtime, and maintaining a sleep environment that is cool, dark and quiet.  6.  Weight loss for overweight patients is recommended.  Even modest amounts of weight loss can significantly improve the severity of sleep apnea.  7.  Snoring recommendations include:  weight loss where appropriate, side sleeping, and avoidance of alcohol before bed.  8.  Operation of motor vehicle should be avoided when sleepy.  Signature: Armanda Magic, MD; [facility]; Diplomat, American Board of Sleep Medicine  Electronically Signed: 05/15/2023 8:02:35 PM

## 2023-05-16 ENCOUNTER — Encounter: Payer: Self-pay | Admitting: Cardiology

## 2023-05-17 ENCOUNTER — Telehealth: Payer: Self-pay | Admitting: *Deleted

## 2023-05-17 NOTE — Telephone Encounter (Signed)
-----   Message from Armanda Magic sent at 05/15/2023  8:08 PM EDT ----- Minimal sleep apnea that occurs most in the supine position.  Patient needs to practice good sleep hygiene and avoid sleeping supine. set up OV to discuss treatment options.

## 2023-05-17 NOTE — Telephone Encounter (Signed)
The patient has been notified of the result and verbalized understanding.  All questions (if any) were answered. Latrelle Dodrill, CMA 05/17/2023 5:40 PM    Patient has an ablation scheduled and once he is finished with everything he will call back our office back to make the ov appointment.

## 2023-05-18 ENCOUNTER — Ambulatory Visit: Payer: Medicare HMO | Attending: Cardiology

## 2023-05-18 DIAGNOSIS — I480 Paroxysmal atrial fibrillation: Secondary | ICD-10-CM

## 2023-05-19 LAB — BASIC METABOLIC PANEL
BUN/Creatinine Ratio: 15 (ref 10–24)
BUN: 16 mg/dL (ref 8–27)
CO2: 25 mmol/L (ref 20–29)
Calcium: 9.5 mg/dL (ref 8.6–10.2)
Chloride: 100 mmol/L (ref 96–106)
Creatinine, Ser: 1.08 mg/dL (ref 0.76–1.27)
Glucose: 158 mg/dL — ABNORMAL HIGH (ref 70–99)
Potassium: 3.8 mmol/L (ref 3.5–5.2)
Sodium: 139 mmol/L (ref 134–144)
eGFR: 73 mL/min/{1.73_m2} (ref >59)

## 2023-05-19 LAB — CBC
Hematocrit: 43.9 % (ref 37.5–51.0)
Hemoglobin: 14.3 g/dL (ref 13.0–17.7)
MCH: 30.9 pg (ref 26.6–33.0)
MCHC: 32.6 g/dL (ref 31.5–35.7)
MCV: 95 fL (ref 79–97)
Platelets: 212 10*3/uL (ref 150–450)
RBC: 4.63 x10E6/uL (ref 4.14–5.80)
RDW: 13 % (ref 11.6–15.4)
WBC: 9 10*3/uL (ref 3.4–10.8)

## 2023-05-23 ENCOUNTER — Telehealth (HOSPITAL_COMMUNITY): Payer: Self-pay | Admitting: *Deleted

## 2023-05-23 NOTE — Telephone Encounter (Signed)
Reaching out to patient to offer assistance regarding upcoming cardiac imaging study; pt verbalizes understanding of appt date/time, parking situation and where to check in, pre-test NPO status, and verified current allergies; name and call back number provided for further questions should they arise  Larey Brick RN Navigator Cardiac Imaging [facility] [phone] office [phone] cell  Patient is aware to arrive at 8:30 AM.

## 2023-05-25 ENCOUNTER — Encounter (HOSPITAL_COMMUNITY): Payer: Self-pay

## 2023-05-25 ENCOUNTER — Telehealth: Payer: Self-pay | Admitting: Cardiology

## 2023-05-25 ENCOUNTER — Ambulatory Visit (HOSPITAL_COMMUNITY)
Admission: RE | Admit: 2023-05-25 | Discharge: 2023-05-25 | Disposition: A | Discharge status: Home / Self Care | Payer: Medicare HMO | Source: Ambulatory Visit | Attending: Internal Medicine | Admitting: Internal Medicine

## 2023-05-25 DIAGNOSIS — I480 Paroxysmal atrial fibrillation: Secondary | ICD-10-CM | POA: Insufficient documentation

## 2023-05-25 MED ORDER — METOPROLOL TARTRATE 5 MG/5ML IV SOLN
10.0000 mg | Freq: Once | INTRAVENOUS | Status: AC
  Administered 2023-05-25: 10 mg via INTRAVENOUS

## 2023-05-25 MED ORDER — IOHEXOL 350 MG/ML SOLN
95.0000 mL | Freq: Once | INTRAVENOUS | Status: AC | PRN
  Administered 2023-05-25: 95 mL via INTRAVENOUS

## 2023-05-25 MED ORDER — METOPROLOL TARTRATE 5 MG/5ML IV SOLN
INTRAVENOUS | Status: AC
  Filled 2023-05-25: qty 20

## 2023-05-25 NOTE — Telephone Encounter (Signed)
Patient is calling with questions, concern the medication he needs to take or not take. Please advise

## 2023-05-25 NOTE — Telephone Encounter (Signed)
Answered pt questions, reviewed instruction letter again. Pt has not seen it yet -- aware sending via mychart message. Patient verbalized understanding and agreeable to plan.

## 2023-05-27 NOTE — Pre-Procedure Instructions (Signed)
Pt is scheduled for procedure on Monday 10/21.  He takes Ozempic, which anesthesia requires to hold for 7 days.  He states he took it today and will not take it next week.

## 2023-06-01 NOTE — Pre-Procedure Instructions (Signed)
Instructed patient on the following items: Arrival time 0800 Nothing to eat or drink after midnight No meds AM of procedure Responsible person to drive you home and stay with you for 24 hrs  Have you missed any doses of anti-coagulant Eliquis- takes twice a day, hasn't missed any doses.  Don't take dose on Monday morning.

## 2023-06-03 NOTE — Anesthesia Preprocedure Evaluation (Signed)
Anesthesia Evaluation  Patient identified by MRN, date of birth, ID band Patient awake    Reviewed: Allergy & Precautions, NPO status , Patient's Chart, lab work & pertinent test results  History of Anesthesia Complications Negative for: history of anesthetic complications  Airway Mallampati: II  TM Distance: >3 FB Neck ROM: Full    Dental  (+) Chipped,    Pulmonary neg pulmonary ROS   Pulmonary exam normal        Cardiovascular hypertension, Pt. on medications Normal cardiovascular exam+ dysrhythmias Atrial Fibrillation   TTE 03/14/23: EF 50-55%, valves ok       Neuro/Psych negative neurological ROS     GI/Hepatic Neg liver ROS,GERD  Medicated,,  Endo/Other  diabetes (on Ozempic), Type 2, Oral Hypoglycemic Agents    Renal/GU negative Renal ROS     Musculoskeletal negative musculoskeletal ROS (+)    Abdominal   Peds  Hematology negative hematology ROS (+)   Anesthesia Other Findings Day of surgery medications reviewed with patient.  Reproductive/Obstetrics negative OB ROS                              Anesthesia Physical Anesthesia Plan  ASA: 3  Anesthesia Plan: General   Post-op Pain Management: Minimal or no pain anticipated   Induction: Intravenous  PONV Risk Score and Plan: 2 and Treatment may vary due to age or medical condition, Dexamethasone and Ondansetron  Airway Management Planned: Oral ETT  Additional Equipment: None  Intra-op Plan:   Post-operative Plan: Extubation in OR  Informed Consent: I have reviewed the patients History and Physical, chart, labs and discussed the procedure including the risks, benefits and alternatives for the proposed anesthesia with the patient or authorized representative who has indicated his/her understanding and acceptance.     Dental advisory given  Plan Discussed with: CRNA  Anesthesia Plan Comments:           Anesthesia Quick Evaluation

## 2023-06-04 ENCOUNTER — Other Ambulatory Visit: Payer: Self-pay

## 2023-06-04 ENCOUNTER — Ambulatory Visit (HOSPITAL_COMMUNITY)
Admission: RE | Admit: 2023-06-04 | Discharge: 2023-06-04 | Disposition: A | Discharge status: Home / Self Care | Payer: Medicare HMO | Attending: Cardiology | Admitting: Cardiology

## 2023-06-04 ENCOUNTER — Ambulatory Visit (HOSPITAL_COMMUNITY): Payer: Self-pay | Admitting: Anesthesiology

## 2023-06-04 ENCOUNTER — Ambulatory Visit (HOSPITAL_BASED_OUTPATIENT_CLINIC_OR_DEPARTMENT_OTHER): Payer: Self-pay | Admitting: Anesthesiology

## 2023-06-04 ENCOUNTER — Ambulatory Visit (HOSPITAL_COMMUNITY)
Admission: RE | Disposition: A | Discharge status: Home / Self Care | Payer: Self-pay | Source: Home / Self Care | Attending: Cardiology

## 2023-06-04 DIAGNOSIS — I480 Paroxysmal atrial fibrillation: Secondary | ICD-10-CM | POA: Insufficient documentation

## 2023-06-04 DIAGNOSIS — Z7985 Long-term (current) use of injectable non-insulin antidiabetic drugs: Secondary | ICD-10-CM | POA: Diagnosis not present

## 2023-06-04 DIAGNOSIS — E119 Type 2 diabetes mellitus without complications: Secondary | ICD-10-CM | POA: Diagnosis not present

## 2023-06-04 DIAGNOSIS — I10 Essential (primary) hypertension: Secondary | ICD-10-CM | POA: Insufficient documentation

## 2023-06-04 DIAGNOSIS — I4891 Unspecified atrial fibrillation: Secondary | ICD-10-CM | POA: Diagnosis not present

## 2023-06-04 HISTORY — PX: ATRIAL FIBRILLATION ABLATION: EP1191

## 2023-06-04 LAB — GLUCOSE, CAPILLARY
Glucose-Capillary: 174 mg/dL — ABNORMAL HIGH (ref 70–99)
Glucose-Capillary: 138 mg/dL — ABNORMAL HIGH (ref 70–99)
Glucose-Capillary: 144 mg/dL — ABNORMAL HIGH (ref 70–99)

## 2023-06-04 LAB — POCT ACTIVATED CLOTTING TIME: Activated Clotting Time: 318 s

## 2023-06-04 SURGERY — ATRIAL FIBRILLATION ABLATION
Anesthesia: General

## 2023-06-04 MED ORDER — ATROPINE SULFATE 1 MG/ML IV SOLN
INTRAVENOUS | Status: DC | PRN
Start: 2023-06-04 — End: 2023-06-04
  Administered 2023-06-04: 1 mg via INTRAVENOUS

## 2023-06-04 MED ORDER — HEPARIN SODIUM (PORCINE) 1000 UNIT/ML IJ SOLN
INTRAMUSCULAR | Status: DC | PRN
Start: 2023-06-04 — End: 2023-06-04
  Administered 2023-06-04: 15000 [IU] via INTRAVENOUS
  Administered 2023-06-04: 1000 [IU] via INTRAVENOUS

## 2023-06-04 MED ORDER — PROTAMINE SULFATE 10 MG/ML IV SOLN
INTRAVENOUS | Status: DC | PRN
Start: 2023-06-04 — End: 2023-06-04
  Administered 2023-06-04: 40 mg via INTRAVENOUS

## 2023-06-04 MED ORDER — ACETAMINOPHEN 325 MG PO TABS
650.0000 mg | ORAL_TABLET | ORAL | Status: DC | PRN
  Administered 2023-06-04: 650 mg via ORAL
  Filled 2023-06-04: qty 2

## 2023-06-04 MED ORDER — FENTANYL CITRATE (PF) 250 MCG/5ML IJ SOLN
INTRAMUSCULAR | Status: DC | PRN
  Administered 2023-06-04: 50 ug via INTRAVENOUS
  Administered 2023-06-04 (×2): 25 ug via INTRAVENOUS

## 2023-06-04 MED ORDER — LIDOCAINE 2% (20 MG/ML) 5 ML SYRINGE
INTRAMUSCULAR | Status: DC | PRN
  Administered 2023-06-04: 80 mg via INTRAVENOUS
  Administered 2023-06-04: 20 mg via INTRAVENOUS

## 2023-06-04 MED ORDER — DROPERIDOL 2.5 MG/ML IJ SOLN: 0.6250 mg | Freq: Once | INTRAMUSCULAR | Status: DC | PRN

## 2023-06-04 MED ORDER — FENTANYL CITRATE (PF) 100 MCG/2ML IJ SOLN: 25.0000 ug | INTRAMUSCULAR | Status: DC | PRN

## 2023-06-04 MED ORDER — ROCURONIUM BROMIDE 10 MG/ML (PF) SYRINGE
PREFILLED_SYRINGE | INTRAVENOUS | Status: DC | PRN
  Administered 2023-06-04: 70 mg via INTRAVENOUS

## 2023-06-04 MED ORDER — HEPARIN (PORCINE) IN NACL 1000-0.9 UT/500ML-% IV SOLN
INTRAVENOUS | Status: DC | PRN
  Administered 2023-06-04 (×2): 500 mL

## 2023-06-04 MED ORDER — SODIUM CHLORIDE 0.9% FLUSH: 3.0000 mL | INTRAVENOUS | Status: DC | PRN

## 2023-06-04 MED ORDER — ONDANSETRON HCL 4 MG/2ML IJ SOLN
INTRAMUSCULAR | Status: DC | PRN
  Administered 2023-06-04: 4 mg via INTRAVENOUS

## 2023-06-04 MED ORDER — DEXAMETHASONE SODIUM PHOSPHATE 10 MG/ML IJ SOLN
INTRAMUSCULAR | Status: DC | PRN
  Administered 2023-06-04: 4 mg via INTRAVENOUS

## 2023-06-04 MED ORDER — ATROPINE SULFATE 1 MG/10ML IJ SOSY
PREFILLED_SYRINGE | INTRAMUSCULAR | Status: AC
  Filled 2023-06-04: qty 10

## 2023-06-04 MED ORDER — PHENYLEPHRINE 80 MCG/ML (10ML) SYRINGE FOR IV PUSH (FOR BLOOD PRESSURE SUPPORT)
PREFILLED_SYRINGE | INTRAVENOUS | Status: DC | PRN
  Administered 2023-06-04 (×2): 160 ug via INTRAVENOUS
  Administered 2023-06-04: 120 ug via INTRAVENOUS
  Administered 2023-06-04 (×2): 160 ug via INTRAVENOUS
  Administered 2023-06-04: 80 ug via INTRAVENOUS

## 2023-06-04 MED ORDER — FENTANYL CITRATE (PF) 100 MCG/2ML IJ SOLN
INTRAMUSCULAR | Status: AC
  Filled 2023-06-04: qty 2

## 2023-06-04 MED ORDER — ONDANSETRON HCL 4 MG/2ML IJ SOLN: 4.0000 mg | Freq: Four times a day (QID) | INTRAMUSCULAR | Status: DC | PRN

## 2023-06-04 MED ORDER — SUGAMMADEX SODIUM 200 MG/2ML IV SOLN
INTRAVENOUS | Status: DC | PRN
  Administered 2023-06-04: 200 mg via INTRAVENOUS

## 2023-06-04 MED ORDER — SODIUM CHLORIDE 0.9 % IV SOLN: INTRAVENOUS | Status: DC

## 2023-06-04 MED ORDER — SODIUM CHLORIDE 0.9% FLUSH: 10.0000 mL | Freq: Two times a day (BID) | INTRAVENOUS | Status: DC

## 2023-06-04 MED ORDER — SODIUM CHLORIDE 0.9% FLUSH: 3.0000 mL | Freq: Two times a day (BID) | INTRAVENOUS | Status: DC

## 2023-06-04 MED ORDER — PROPOFOL 10 MG/ML IV BOLUS
INTRAVENOUS | Status: DC | PRN
  Administered 2023-06-04: 160 mg via INTRAVENOUS
  Administered 2023-06-04: 20 mg via INTRAVENOUS

## 2023-06-04 SURGICAL SUPPLY — 21 items
BAG SNAP BAND KOVER 36X36 (MISCELLANEOUS) ×1
CABLE PFA RX CATH CONN (CABLE) ×1
CATH FARAWAVE ABLATION 31 (CATHETERS) ×1
CATH OCTARAY 2.0 F 3-3-3-3-3 (CATHETERS) ×1
CATH SOUNDSTAR ECO 8FR (CATHETERS) ×1
CATH WEBSTER BI DIR CS D-F CRV (CATHETERS) ×1
CLOSURE MYNX CONTROL 5F (Vascular Products) ×1 IMPLANT
CLOSURE MYNX CONTROL 6F/7F (Vascular Products) ×1 IMPLANT
CLOSURE PERCLOSE PROSTYLE (VASCULAR PRODUCTS) ×2
COVER SWIFTLINK CONNECTOR (BAG) ×1
DILATOR VESSEL 38 20CM 16FR (INTRODUCER) ×1
INQWIRE 1.5J .035X260CM (WIRE) ×1
PACK EP LATEX FREE (CUSTOM PROCEDURE TRAY) ×1
PACK EP LF (CUSTOM PROCEDURE TRAY) ×1
PAD DEFIB RADIO PHYSIO CONN (PAD) ×1
PATCH CARTO3 (PAD) ×1
SHEATH FARADRIVE STEERABLE (SHEATH) ×1
SHEATH PINNACLE 8F 10CM (SHEATH) ×2
SHEATH PINNACLE 9F 10CM (SHEATH) ×1
SHEATH PROBE COVER 6X72 (BAG) ×1
SHEATH WIRE KIT BAYLIS SL1 (KITS) ×1

## 2023-06-04 NOTE — Anesthesia Procedure Notes (Signed)
Procedure Name: Intubation Date/Time: 06/04/2023 10:12 AM  Performed by: Stanton Kidney, CRNAPre-anesthesia Checklist: Patient identified, Patient being monitored, Timeout performed, Emergency Drugs available and Suction available Patient Re-evaluated:Patient Re-evaluated prior to induction Oxygen Delivery Method: Circle system utilized Preoxygenation: Pre-oxygenation with 100% oxygen Induction Type: IV induction Ventilation: Mask ventilation without difficulty Laryngoscope Size: 3 and Miller Grade View: Grade I Tube type: Oral Tube size: 7.5 mm Number of attempts: 1 Airway Equipment and Method: Stylet Placement Confirmation: ETT inserted through vocal cords under direct vision, positive ETCO2 and breath sounds checked- equal and bilateral Secured at: 23 cm Tube secured with: Tape Dental Injury: Teeth and Oropharynx as per pre-operative assessment

## 2023-06-04 NOTE — Progress Notes (Signed)
Patient walked to the bathroom without difficulties. Bilateral groins level 0, clean, dry, and intact.

## 2023-06-04 NOTE — Discharge Instructions (Signed)

## 2023-06-04 NOTE — H&P (Signed)
  Electrophysiology Office Note:   Date:  06/04/2023  ID:  Kyle Gibson, DOB 02/28/1951, MRN [id number]  Primary Cardiologist: None Electrophysiologist: Kyle Tiedt Kyle Loa, MD      History of Present Illness:   Kyle Gibson is a 72 y.o. male with h/o coronary calcium, hypertension, type 2 diabetes, atrial fibrillation seen today for  for Electrophysiology evaluation of atrial fibrillation at the request of Kyle Gibson.    Today, denies symptoms of palpitations, chest pain, shortness of breath, orthopnea, PND, lower extremity edema, claudication, dizziness, presyncope, syncope, bleeding, or neurologic sequela. The patient is tolerating medications without difficulties. Plan AF ablation today.     EP Information / Studies Reviewed:    EKG is ordered today. Personal review as below.      ICD Interrogation-  reviewed in detail today,  See PACEART report.   Risk Assessment/Calculations:    CHA2DS2-VASc Score = 4   This indicates a 4.8% annual risk of stroke. The patient's score is based upon: CHF History: 0 HTN History: 1 Diabetes History: 1 Stroke History: 0 Vascular Disease History: 1 Age Score: 1 Gender Score: 0            Physical Exam:   VS:  BP (!) 167/84   Pulse 94   Temp 98 F (36.7 C) (Oral)   Resp 17   Ht 5\' 7"  (1.702 m)   Wt 78.9 kg   SpO2 97%   BMI 27.25 kg/m    Wt Readings from Last 3 Encounters:  06/04/23 78.9 kg  04/23/23 83.2 kg  04/13/23 82.6 kg    GEN: No acute distress.   Neck: No JVD Cardiac: RRR, no murmurs, rubs, or gallops.  Respiratory: decreased BS bases bilaterally. GI: Soft, nontender, non-distended  MS: No edema; No deformity. Neuro:  Nonfocal  Skin: warm and dry,  Psych: Normal affect    ASSESSMENT AND PLAN:    1.  Paroxysmal atrial fibrillation: Kyle Gibson has presented today for surgery, with the diagnosis of af.  The various methods of treatment have been discussed with the patient and family. After consideration of risks,  benefits and other options for treatment, the patient has consented to  Procedure(s): Catheter ablation as a surgical intervention .  Risks include but not limited to complete heart block, stroke, esophageal damage, nerve damage, bleeding, vascular damage, tamponade, perforation, MI, and death. The patient's history has been reviewed, patient examined, no change in status, stable for surgery.  I have reviewed the patient's chart and labs.  Questions were answered to the patient's satisfaction.    Kyle Gibson Kyle Fortis, MD 06/04/2023 9:13 AM

## 2023-06-04 NOTE — Anesthesia Postprocedure Evaluation (Signed)
Anesthesia Post Note  Patient: Kyle Gibson  Procedure(s) Performed: ATRIAL FIBRILLATION ABLATION     Patient location during evaluation: PACU Anesthesia Type: General Level of consciousness: awake and alert Pain management: pain level controlled Vital Signs Assessment: post-procedure vital signs reviewed and stable Respiratory status: spontaneous breathing, nonlabored ventilation and respiratory function stable Cardiovascular status: blood pressure returned to baseline Postop Assessment: no apparent nausea or vomiting Anesthetic complications: no   There were no known notable events for this encounter.  Last Vitals:  Vitals:   06/04/23 1200 06/04/23 1205  BP: 127/77 122/89  Pulse: 91 90  Resp: 17 (!) 21  Temp:  37.2 C  SpO2: 94% 96%    Last Pain:  Vitals:   06/04/23 1219  TempSrc:   PainSc: 0-No pain                 Shanda Howells

## 2023-06-04 NOTE — Transfer of Care (Signed)
Immediate Anesthesia Transfer of Care Note  Patient: Natalio Bustillos  Procedure(s) Performed: ATRIAL FIBRILLATION ABLATION  Patient Location: PACU  Anesthesia Type:General  Level of Consciousness: awake, alert , and oriented  Airway & Oxygen Therapy: Patient Spontanous Breathing and Patient connected to nasal cannula oxygen  Post-op Assessment: Report given to RN and Post -op Vital signs reviewed and stable  Post vital signs: Reviewed and stable  Last Vitals:  Vitals Value Taken Time  BP 123/74 06/04/23 1135  Temp    Pulse 90 06/04/23 1140  Resp 11 06/04/23 1140  SpO2 94 % 06/04/23 1140  Vitals shown include unfiled device data.  Last Pain:  Vitals:   06/04/23 0820  TempSrc:   PainSc: 0-No pain         Complications: There were no known notable events for this encounter.

## 2023-06-05 ENCOUNTER — Encounter (HOSPITAL_COMMUNITY): Payer: Self-pay | Admitting: Cardiology

## 2023-06-05 MED FILL — Fentanyl Citrate Soln Prefilled Syringe 100 MCG/2ML: INTRAMUSCULAR | Qty: 2 | Status: AC

## 2023-06-07 ENCOUNTER — Telehealth: Payer: Self-pay | Admitting: Cardiology

## 2023-06-07 NOTE — Telephone Encounter (Signed)
Kyle Gibson from [employer] is requesting for [place] to fax over the OV from 04/23/23 and 04/13/23. Kyle Gibson stated she will be sending a fax requesting these medical records. Please advise.

## 2023-06-07 NOTE — Telephone Encounter (Signed)
This information needs to  obtained from   H.I.M  management

## 2023-06-08 ENCOUNTER — Encounter: Payer: Self-pay | Admitting: Cardiology

## 2023-06-15 NOTE — Telephone Encounter (Signed)
HIM number [phone]

## 2023-06-18 NOTE — Telephone Encounter (Signed)
Left message with information for H.I.M for patient

## 2023-06-20 ENCOUNTER — Other Ambulatory Visit: Payer: Self-pay | Admitting: Cardiology

## 2023-07-02 ENCOUNTER — Ambulatory Visit (HOSPITAL_COMMUNITY)
Admission: RE | Admit: 2023-07-02 | Discharge: 2023-07-02 | Disposition: A | Discharge status: Home / Self Care | Payer: Medicare HMO | Source: Ambulatory Visit | Attending: Internal Medicine | Admitting: Internal Medicine

## 2023-07-02 VITALS — BP 162/76 | HR 74 | Ht 67.0 in | Wt 179.0 lb

## 2023-07-02 DIAGNOSIS — I440 Atrioventricular block, first degree: Secondary | ICD-10-CM | POA: Insufficient documentation

## 2023-07-02 DIAGNOSIS — Z79899 Other long term (current) drug therapy: Secondary | ICD-10-CM | POA: Insufficient documentation

## 2023-07-02 DIAGNOSIS — D6869 Other thrombophilia: Secondary | ICD-10-CM | POA: Diagnosis not present

## 2023-07-02 DIAGNOSIS — I700 Atherosclerosis of aorta: Secondary | ICD-10-CM | POA: Diagnosis present

## 2023-07-02 DIAGNOSIS — E119 Type 2 diabetes mellitus without complications: Secondary | ICD-10-CM | POA: Insufficient documentation

## 2023-07-02 DIAGNOSIS — I2510 Atherosclerotic heart disease of native coronary artery without angina pectoris: Secondary | ICD-10-CM | POA: Diagnosis present

## 2023-07-02 DIAGNOSIS — Z7901 Long term (current) use of anticoagulants: Secondary | ICD-10-CM | POA: Diagnosis not present

## 2023-07-02 DIAGNOSIS — Z7984 Long term (current) use of oral hypoglycemic drugs: Secondary | ICD-10-CM | POA: Insufficient documentation

## 2023-07-02 DIAGNOSIS — I10 Essential (primary) hypertension: Secondary | ICD-10-CM | POA: Diagnosis not present

## 2023-07-02 DIAGNOSIS — I480 Paroxysmal atrial fibrillation: Secondary | ICD-10-CM | POA: Diagnosis not present

## 2023-07-02 NOTE — Progress Notes (Signed)
Primary Care Physician: Charlane Ferretti, DO Primary Cardiologist: None Electrophysiologist: Will Jorja Loa, MD     Referring Physician: [facility]     Kyle Gibson is a 72 y.o. male with a history of mild coronary and aortic atherosclerosis by imaging, HTN, T2DM, and paroxysmal atrial fibrillation who presents for consultation in the [facility]. Patient was seen in ED on 7/12 and 7/15 for palpitations found to be in Afib with RVR. He underwent emergent DCCV on 7/12 with successful conversion to NSR. He has been taking Toprol 25 mg daily. Patient is on Eliquis for a CHADS2VASC score of 4.  On evaluation today, he is currently in NSR. He has an Scientist, physiological but has not set it up to record heart rhythm. His watch shows HR 53-133. He notes that this morning he woke up in Afib and on the way to clinic was back in SR. He feels palpitations or quivering sensation. He states he has felt like he is going in and out of Afib. He does not have any bleeding issues with anticoagulation. He does not drink alcohol. He drinks 1 cup of coffee daily.  On follow up 07/02/23, he is currently in NSR. S/p Afib ablation on 06/04/23 by Dr. Elberta Fortis. No episodes of Afib since ablation. He does note having constipation with amiodarone and is interested in reducing the dose due to this. No chest pain, SOB, or trouble swallowing. Leg sites healed without issue. No missed doses of anticoagulant.  Today, he denies symptoms of orthopnea, PND, lower extremity edema, dizziness, presyncope, syncope, snoring, daytime somnolence, bleeding, or neurologic sequela. The patient is tolerating medications without difficulties and is otherwise without complaint today.    Atrial Fibrillation Risk Factors:  he does not have symptoms or diagnosis of sleep apnea.   he has a BMI of Body mass index is 28.04 kg/m.Marland Kitchen Filed Weights   07/02/23 0958  Weight: 81.2 kg     Current Outpatient Medications   Medication Sig Dispense Refill   acetaminophen (TYLENOL) 500 MG tablet Take 1,000 mg by mouth every 6 (six) hours as needed for moderate pain.     amiodarone (PACERONE) 200 MG tablet Take 2 tablets (400 mg total) once daily for 2 weeks, then take 1 tablet (200 mg total) once daily (Patient taking differently: Take 200 mg by mouth daily.) 90 tablet 2   amLODipine (NORVASC) 10 MG tablet Take 10 mg by mouth in the morning.     apixaban (ELIQUIS) 5 MG TABS tablet Take 1 tablet (5 mg total) by mouth 2 (two) times daily. 180 tablet 1   Bismuth Subsalicylate (PEPTO-BISMOL PO) Take by mouth as needed.     famotidine (PEPCID) 20 MG tablet Take 20 mg by mouth as needed for heartburn or indigestion.     hydrochlorothiazide (HYDRODIURIL) 25 MG tablet Take 25 mg by mouth in the morning.     Magnesium Oxide -Mg Supplement 400 MG CAPS Take 400 mg daily 90 capsule 3   metFORMIN (GLUCOPHAGE-XR) 500 MG 24 hr tablet Take 1,000 mg by mouth 2 (two) times daily.     metoprolol succinate (TOPROL-XL) 25 MG 24 hr tablet Take 1 tablet (25 mg total) by mouth daily. 90 tablet 2   olmesartan (BENICAR) 40 MG tablet Take 40 mg by mouth every evening.     rosuvastatin (CRESTOR) 10 MG tablet Take 10 mg by mouth daily.     Semaglutide (OZEMPIC, 1 MG/DOSE, [place]) Inject 1 mg into the  skin every Monday.     No current facility-administered medications for this encounter.    Atrial Fibrillation Management history:  Previous antiarrhythmic drugs: None  Previous cardioversions: 02/23/23 Previous ablations: 06/04/23 Anticoagulation history: Eliquis 5 mg BID   ROS- All systems are reviewed and negative except as per the HPI above.  Physical Exam: BP (!) 162/76   Pulse 74   Ht 5\' 7"  (1.702 m)   Wt 81.2 kg   BMI 28.04 kg/m   GEN- The patient is well appearing, alert and oriented x 3 today.   Neck - no JVD or carotid bruit noted Lungs- Clear to ausculation bilaterally, normal work of breathing Heart- Regular rate and  rhythm, no murmurs, rubs or gallops, PMI not laterally displaced Extremities- no clubbing, cyanosis, or edema Skin - no rash or ecchymosis noted   EKG today demonstrates  Vent. rate 74 BPM PR interval 214 ms QRS duration 96 ms QT/QTcB 418/463 ms P-R-T axes 75 46 28 Sinus rhythm with 1st degree A-V block Otherwise normal ECG When compared with ECG of 04-Jun-2023 11:38, PREVIOUS ECG IS PRESENT  Echo 03/14/23: 1. Left ventricular ejection fraction, by estimation, is 50 to 55%. The  left ventricle has low normal function. The left ventricle has no regional  wall motion abnormalities. Left ventricular diastolic parameters were  normal.   2. Right ventricular systolic function is normal. The right ventricular  size is normal. Tricuspid regurgitation signal is inadequate for assessing  PA pressure.   3. The mitral valve is normal in structure. Trivial mitral valve  regurgitation. No evidence of mitral stenosis.   4. The aortic valve is tricuspid. Aortic valve regurgitation is not  visualized. No aortic stenosis is present.   5. The inferior vena cava is normal in size with greater than 50%  respiratory variability, suggesting right atrial pressure of 3 mmHg.   ASSESSMENT & PLAN CHA2DS2-VASc Score = 4  The patient's score is based upon: CHF History: 0 HTN History: 1 Diabetes History: 1 Stroke History: 0 Vascular Disease History: 1 Age Score: 1 Gender Score: 0       ASSESSMENT AND PLAN: Paroxysmal Atrial Fibrillation (ICD10:  I48.0) The patient's CHA2DS2-VASc score is 4, indicating a 4.8% annual risk of stroke. Mild coronary artery and aortic atherosclerosis by imaging; I will consider this a risk factor. S/p successful DCCV on 02/23/23. Patient states he is going in and out of Afib.  S/p Afib ablation on 06/04/23 by Dr. Elberta Fortis.  He is in NSR. He is going to use a laxative to see if helps his constipation. If he does not have relief, then will decrease amiodarone to 100 mg  daily and this is reasonable to do. Continue amiodarone 200 mg daily for now, can lower to 100 mg daily if no relief from constipation.   Secondary Hypercoagulable State (ICD10:  D68.69) The patient is at significant risk for stroke/thromboembolism based upon his CHA2DS2-VASc Score of 4.  Continue Apixaban (Eliquis).  No missed doses.  CAC score of 1141 After discussion with Dr. Bjorn Pippin, no indication to pursue antiplatelet therapy given he is on Eliquis. Continue Crestor.    Follow up as scheduled with Canary Brim, NP.    Lake Bells, PA-C  Afib Clinic [facility] [address] [place], [place] [address] [phone]

## 2023-07-11 ENCOUNTER — Encounter: Payer: Self-pay | Admitting: Cardiology

## 2023-07-16 ENCOUNTER — Encounter: Payer: Self-pay | Admitting: Cardiology

## 2023-07-18 ENCOUNTER — Encounter: Payer: Self-pay | Admitting: Cardiology

## 2023-09-04 NOTE — Progress Notes (Unsigned)
Electrophysiology Office Note:   Date:  09/05/2023  ID:  Kyle Gibson, DOB 1951/01/01, MRN [id number]  Primary Cardiologist: None Primary Heart Failure: None Electrophysiologist: Will Jorja Loa, MD      History of Present Illness:   Kyle Gibson is a 73 y.o. male, retired Runner, broadcasting/film/video, with h/o AF, DM II seen today for routine electrophysiology followup.   Since last being seen in our clinic the patient reports doing very well. He monitors his HR very closely and has not had any evidence of AF after his ablation.  He typically runs 50-105 bpm.  He had already stopped his amiodarone on 08/08/23. No missed doses of OAC. He denies chest pain, palpitations, dyspnea, PND, orthopnea, nausea, vomiting, dizziness, syncope, edema, weight gain, or early satiety.   Review of systems complete and found to be negative unless listed in HPI.   EP Information / Studies Reviewed:    EKG is ordered today. Personal review as below.  EKG Interpretation Date/Time:  Wednesday September 05 2023 11:40:11 EST Ventricular Rate:  86 PR Interval:    QRS Duration:  86 QT Interval:  374 QTC Calculation: 447 R Axis:   64  Text Interpretation: Artifact , see scanned EKG from 09/05/23 or in note Confirmed by Canary Brim ([id number]) on 09/05/2023 4:29:46 PM    EKG NSR 87 bpm  Studies:  EPS 06/04/23 > pulse field ablation of all four pulmonary veins, ablation of posterior wall   Arrhythmia / AAD AF  Amiodarone > pre-ablation, stopped 07/19/23 post   Risk Assessment/Calculations:    CHA2DS2-VASc Score = 4   This indicates a 4.8% annual risk of stroke. The patient's score is based upon: CHF History: 0 HTN History: 1 Diabetes History: 1 Stroke History: 0 Vascular Disease History: 1 Age Score: 1 Gender Score: 0    HYPERTENSION CONTROL Vitals:   09/05/23 1236 09/05/23 1240  BP: (!) 182/84 (!) 140/88    The patient's blood pressure is elevated above target today.  In order to address the patient's  elevated BP: The blood pressure is usually elevated in clinic.  Blood pressures monitored at home have been optimal.           Physical Exam:   VS:  BP (!) 140/88   Pulse 86   Ht 5\' 8"  (1.727 m)   SpO2 98%   BMI 27.22 kg/m    Wt Readings from Last 3 Encounters:  07/02/23 179 lb (81.2 kg)  06/04/23 174 lb (78.9 kg)  04/23/23 183 lb 6.4 oz (83.2 kg)     GEN: Well nourished, well developed in no acute distress NECK: No JVD; No carotid bruits CARDIAC: Regular rate and rhythm, no murmurs, rubs, gallops RESPIRATORY:  Clear to auscultation without rales, wheezing or rhonchi  ABDOMEN: Soft, non-tender, non-distended EXTREMITIES:  No edema; No deformity   ASSESSMENT AND PLAN:    Atrial Fibrillation  CHA2DS2-VASc 4. PVI ablation with pulse field 05/2023.  -pt stopped amiodarone 07/19/23 -Toprol 25mg  daily -anticoagulation for stroke prophylaxis  -EKG with NSR > note initial EKG in chart had artifact that looked like AF at first review, on exam NSR / pulse regular. Repeated EKG and is in NSR.  See above.    -no recurrent burden of symptoms   -monitors with an Apple Watch -pt instructed to call with new symptoms   Secondary Hypercoagulable State  -continue Eliquis 5mg  BID, dose reviewed and appropriate by age/wt  Hypertension  -elevated in clinic, reports control at home > repeat better as  above.  -hold changes for now    Follow up with Dr. Elberta Fortis in 6 months  Signed, Canary Brim, NP-C, AGACNP-BC [facility] - Electrophysiology  09/05/2023, 4:32 PM

## 2023-09-05 ENCOUNTER — Encounter: Payer: Self-pay | Admitting: Pulmonary Disease

## 2023-09-05 ENCOUNTER — Ambulatory Visit: Payer: Medicare HMO | Attending: Pulmonary Disease | Admitting: Pulmonary Disease

## 2023-09-05 VITALS — BP 140/88 | HR 86 | Ht 68.0 in

## 2023-09-05 DIAGNOSIS — I480 Paroxysmal atrial fibrillation: Secondary | ICD-10-CM

## 2023-09-05 DIAGNOSIS — D6869 Other thrombophilia: Secondary | ICD-10-CM

## 2023-09-05 DIAGNOSIS — I10 Essential (primary) hypertension: Secondary | ICD-10-CM | POA: Diagnosis not present

## 2023-09-05 MED ORDER — APIXABAN 5 MG PO TABS: 5.0000 mg | ORAL_TABLET | Freq: Two times a day (BID) | ORAL | 0 refills | Status: DC

## 2023-09-05 MED ORDER — APIXABAN 5 MG PO TABS: 5.0000 mg | ORAL_TABLET | Freq: Two times a day (BID) | ORAL | 3 refills | Status: AC

## 2023-09-05 NOTE — Patient Instructions (Signed)
Medication Instructions:   Your physician recommends that you continue on your current medications as directed. Please refer to the Current Medication list given to you today.   *If you need a refill on your cardiac medications before your next appointment, please call your pharmacy*    Lab Work:  NONE ORDERED  TODAY     If you have labs (blood work) drawn today and your tests are completely normal, you will receive your results only by: MyChart Message (if you have MyChart) OR A paper copy in the mail If you have any lab test that is abnormal or we need to change your treatment, we will call you to review the results.   Testing/Procedures:  NONE ORDERED  TODAY      Follow-Up: At [facility], you and your health needs are our priority.  As part of our continuing mission to provide you with exceptional heart care, we have created designated Provider Care Teams.  These Care Teams include your primary Cardiologist (physician) and Advanced Practice Providers (APPs -  Physician Assistants and Nurse Practitioners) who all work together to provide you with the care you need, when you need it.  We recommend signing up for the patient portal called "MyChart".  Sign up information is provided on this After Visit Summary.  MyChart is used to connect with patients for Virtual Visits (Telemedicine).  Patients are able to view lab/test results, encounter notes, upcoming appointments, etc.  Non-urgent messages can be sent to your provider as well.   To learn more about what you can do with MyChart, go to [email or web address].    Your next appointment:   6 month(s)  Provider:   You may see Will Jorja Loa, MD  Other Instructions

## 2023-09-27 ENCOUNTER — Other Ambulatory Visit: Payer: Self-pay | Admitting: Pulmonary Disease

## 2023-10-22 NOTE — Progress Notes (Unsigned)
 Cardiology Office Note:    Date:  10/23/2023   ID:  Kyle Gibson, DOB Sep 11, 1950, MRN [id number]  PCP:  Charlane Ferretti, DO  Cardiologist:  Little Ishikawa, MD  Electrophysiologist:  Regan Lemming, MD   Referring MD: Charlane Ferretti, DO   Chief Complaint  Patient presents with   Coronary Artery Disease    History of Present Illness:    Kyle Gibson is a 73 y.o. male with a hx of T2DM, hypertension, paroxysmal atrial fibrillation who is referred by Dr. Cherlynn June for evaluation of atrial fibrillation.  He presented to ED twice in July 2024 with palpitations, found to be in A-fib with RVR.  Underwent cardioversion on 02/23/2023 with successful conversion to sinus rhythm.  He was started on Eliquis and Toprol-XL.  He was seen in A-fib clinic 02/28/2023 and started on Multaq and referred to EP to consider ablation.  He was seen by Dr. Elberta Fortis on 04/13/2023 and reported diarrhea on Multaq, switched to amiodarone, with plans to proceed with ablation.  Echo 03/14/2023 showed EF 50 to 55%, normal RV function, no significant valvular disease, normal diastolic function.  Zio patch x 7 days 02/2023 showed 107 episodes of SVT, longest lasting 3 minutes 21 seconds with average rate 131 bpm, occasional PACs (1.4% of beats).  Since last clinic visit, he reports he is doing well.  Reports palpitations have improved since his A-fib ablation, still has occasional palpitations but just last for few seconds.  He reports chest pain that he describes as pinching pain lasting for seconds.  Denies any dyspnea, lower extremity edema, or palpitations.  He walks 2 miles 6 days/week.  He reports some lightheadedness but denies any syncope.  Reports BP 120s over 70s on average when checks at home, checks every day.   Past Medical History:  Diagnosis Date   Diabetes mellitus without complication (HCC)    Hypertension     Past Surgical History:  Procedure Laterality Date   ATRIAL FIBRILLATION ABLATION N/A 06/04/2023    Procedure: ATRIAL FIBRILLATION ABLATION;  Surgeon: Regan Lemming, MD;  Location: [facility] INVASIVE CV LAB;  Service: Cardiovascular;  Laterality: N/A;    Current Medications: Current Meds  Medication Sig   acetaminophen (TYLENOL) 500 MG tablet Take 1,000 mg by mouth every 6 (six) hours as needed for moderate pain.   amLODipine (NORVASC) 10 MG tablet Take 10 mg by mouth in the morning.   Bismuth Subsalicylate (PEPTO-BISMOL PO) Take by mouth as needed.   famotidine (PEPCID) 20 MG tablet Take 20 mg by mouth as needed for heartburn or indigestion.   hydrochlorothiazide (HYDRODIURIL) 25 MG tablet Take 25 mg by mouth in the morning.   Magnesium Oxide -Mg Supplement 400 MG CAPS Take 400 mg daily   metFORMIN (GLUCOPHAGE-XR) 500 MG 24 hr tablet Take 1,000 mg by mouth 2 (two) times daily.   metoprolol succinate (TOPROL-XL) 25 MG 24 hr tablet Take 1 tablet (25 mg total) by mouth daily.   olmesartan (BENICAR) 40 MG tablet Take 40 mg by mouth every evening.   rosuvastatin (CRESTOR) 10 MG tablet Take 10 mg by mouth daily.   Semaglutide (OZEMPIC, 1 MG/DOSE, [place]) Inject 1 mg into the skin every Monday.     Allergies:   Carvedilol, Invokana [canagliflozin], Pregabalin, Empagliflozin, and Multaq [dronedarone]   Social History   Socioeconomic History   Marital status: Married    Spouse name: Not on file   Number of children: Not on file   Years of education: Not on  file   Highest education level: Not on file  Occupational History   Not on file  Tobacco Use   Smoking status: Never   Smokeless tobacco: Never  Vaping Use   Vaping status: Never Used  Substance and Sexual Activity   Alcohol use: Never   Drug use: Never   Sexual activity: Yes    Partners: Female    Comment: married  Other Topics Concern   Not on file  Social History Narrative   Not on file   Social Drivers of Health   Financial Resource Strain: Not on file  Food Insecurity: Not on file  Transportation Needs: Not on file   Physical Activity: Not on file  Stress: Not on file  Social Connections: Not on file     Family History: Father had CHF.  ROS:   Please see the history of present illness.     All other systems reviewed and are negative.  EKGs/Labs/Other Studies Reviewed:    The following studies were reviewed today:   EKG:   04/23/2023: Sinus rhythm, first-degree AV block, rate 79, no ST abnormalities 10/23/23: Normal sinus rhythm, rate 90, nonspecific T wave abnormality  Recent Labs: 02/23/2023: ALT 17 04/13/2023: TSH 2.810 04/23/2023: Magnesium 1.7 05/18/2023: BUN 16; Creatinine, Ser 1.08; Hemoglobin 14.3; Platelets 212; Potassium 3.8; Sodium 139  Recent Lipid Panel    Component Value Date/Time   CHOL 134 04/23/2023 1056   TRIG 111 04/23/2023 1056   HDL 52 04/23/2023 1056   CHOLHDL 2.6 04/23/2023 1056   LDLCALC 62 04/23/2023 1056    Physical Exam:    VS:  BP (!) 148/82   Pulse 90   Ht 5\' 7"  (1.702 m)   Wt 180 lb 3.2 oz (81.7 kg)   SpO2 95%   BMI 28.22 kg/m     Wt Readings from Last 3 Encounters:  10/23/23 180 lb 3.2 oz (81.7 kg)  07/02/23 179 lb (81.2 kg)  06/04/23 174 lb (78.9 kg)     GEN:  Well nourished, well developed in no acute distress HEENT: Normal NECK: No JVD; No carotid bruits LYMPHATICS: No lymphadenopathy CARDIAC: RRR, no murmurs, rubs, gallops RESPIRATORY:  Clear to auscultation without rales, wheezing or rhonchi  ABDOMEN: Soft, non-tender, non-distended MUSCULOSKELETAL:  No edema; No deformity  SKIN: Warm and dry NEUROLOGIC:  Alert and oriented x 3 PSYCHIATRIC:  Normal affect   ASSESSMENT:    1. Paroxysmal atrial fibrillation (HCC)   2. Elevated coronary artery calcium score   3. Essential hypertension   4. Hyperlipidemia, unspecified hyperlipidemia type     PLAN:    Atrial fibrillation: He presented to ED twice in July 2024 with palpitations, found to be in A-fib with RVR.  Underwent cardioversion on 02/23/2023 with successful conversion to sinus  rhythm.  He was started on Eliquis and Toprol-XL.  He was seen in A-fib clinic 02/28/2023 and started on Multaq and referred to EP to consider ablation.  He was seen by Dr. Elberta Fortis on 04/13/2023 and reported diarrhea on Multaq, switched to amiodarone, with plans to proceed with ablation.  Echo 03/14/2023 showed EF 50 to 55%, normal RV function, no significant valvular disease, normal diastolic function.  Zio patch x 7 days 02/2023 showed 107 episodes of SVT, longest lasting 3 minutes 21 seconds with average rate 131 bpm, occasional PACs (1.4% of beats).  Underwent A-fib ablation with Dr. Elberta Fortis 05/2023.  Amiodarone discontinued 07/2023 -Continue Eliquis, Toprol-XL  CAD: Calcium score on preablation CT was 1141 (86 percentile) on  05/25/2023.  Denies anginal symptoms -Stress PET to evaluate for ischemia -Continue Eliquis, statin  Hypertension: On hydrochlorothiazide 25 mg, Toprol-XL 25 mg, amlodipine 10 mg daily, olmesartan 40 mg daily.    Hyperlipidemia: On rosuvastatin 10 mg daily.  LDL 62 on 04/23/2023, at goal LDL less than 70 given calcium score as above  OSA: Mild OSA on sleep study 04/2023, he declines treatment at this time  T2DM: On metformin and Ozempic.  A1c 7.0% on 07/04/23  RTC in 6 months  Informed Consent   Shared Decision Making/Informed Consent The risks [chest pain, shortness of breath, cardiac arrhythmias, dizziness, blood pressure fluctuations, myocardial infarction, stroke/transient ischemic attack, nausea, vomiting, allergic reaction, radiation exposure, metallic taste sensation and life-threatening complications (estimated to be 1 in 10,000)], benefits (risk stratification, diagnosing coronary artery disease, treatment guidance) and alternatives of a cardiac PET stress test were discussed in detail with Mr. Hinojos and he agrees to proceed.      Medication Adjustments/Labs and Tests Ordered: Current medicines are reviewed at length with the patient today.  Concerns regarding  medicines are outlined above.  Orders Placed This Encounter  Procedures   NM PET CT CARDIAC PERFUSION MULTI W/ABSOLUTE BLOODFLOW   Basic Metabolic Panel (BMET)   Magnesium   Cardiac Stress Test: Informed Consent Details: Physician/Practitioner Attestation; Transcribe to consent form and obtain patient signature   EKG 12-Lead   No orders of the defined types were placed in this encounter.   Patient Instructions  Medication Instructions:  No Changes  Lab Work: BMET and Magnesium, Today If you have labs (blood work) drawn today and your tests are completely normal, you will receive your results only by: MyChart Message (if you have MyChart) OR A paper copy in the mail If you have any lab test that is abnormal or we need to change your treatment, we will call you to review the results.   Testing/Procedures:    Please report to Radiology at the [facility] Main Entrance 30 minutes early for your test.  [address] [place], [place] [address]                         OR   Please report to Radiology at [facility] Main Entrance, medical mall, 30 mins prior to your test.  [address]  [place], [place]  How to Prepare for Your Cardiac PET/CT Stress Test:  Nothing to eat or drink, except water, 3 hours prior to arrival time.  NO caffeine/decaffeinated products, or chocolate 12 hours prior to arrival. (Please note decaffeinated beverages (teas/coffees) still contain caffeine).  If you have caffeine within 12 hours prior, the test will need to be rescheduled.  Medication instructions: Do not take erectile dysfunction medications for 72 hours prior to test (sildenafil, tadalafil) Do not take nitrates (isosorbide mononitrate, Ranexa) the day before or day of test Do not take tamsulosin the day before or morning of test Hold theophylline containing medications for 12 hours. Hold Dipyridamole 48 hours prior to the test.  Diabetic  Preparation: If able to eat breakfast prior to 3 hour fasting, you may take all medications, including your insulin. Do not worry if you miss your breakfast dose of insulin - start at your next meal. If you do not eat prior to 3 hour fast-Hold all diabetes (oral and insulin) medications. Patients who wear a continuous glucose monitor MUST remove the device prior to scanning.  You may take your  remaining medications with water.  NO perfume, cologne or lotion on chest or abdomen area. FEMALES - Please avoid wearing dresses to this appointment.  Total time is 1 to 2 hours; you may want to bring reading material for the waiting time.  IF YOU THINK YOU MAY BE PREGNANT, OR ARE NURSING PLEASE INFORM THE TECHNOLOGIST.  In preparation for your appointment, medication and supplies will be purchased.  Appointment availability is limited, so if you need to cancel or reschedule, please call the Radiology Department Scheduler at [phone] 24 hours in advance to avoid a cancellation fee of $100.00  What to Expect When you Arrive:  Once you arrive and check in for your appointment, you will be taken to a preparation room within the Radiology Department.  A technologist or Nurse will obtain your medical history, verify that you are correctly prepped for the exam, and explain the procedure.  Afterwards, an IV will be started in your arm and electrodes will be placed on your skin for EKG monitoring during the stress portion of the exam. Then you will be escorted to the PET/CT scanner.  There, staff will get you positioned on the scanner and obtain a blood pressure and EKG.  During the exam, you will continue to be connected to the EKG and blood pressure machines.  A small, safe amount of a radioactive tracer will be injected in your IV to obtain a series of pictures of your heart along with an injection of a stress agent.    After your Exam:  It is recommended that you eat a meal and drink a caffeinated  beverage to counter act any effects of the stress agent.  Drink plenty of fluids for the remainder of the day and urinate frequently for the first couple of hours after the exam.  Your doctor will inform you of your test results within 7-10 business days.  For Gibson information and frequently asked questions, please visit our website: [email or web address]  For questions about your test or how to prepare for your test, please call: Cardiac Imaging Nurse Navigators Office: [phone]    Follow-Up: At [facility], you and your health needs are our priority.  As part of our continuing mission to provide you with exceptional heart care, we have created designated Provider Care Teams.  These Care Teams include your primary Cardiologist (physician) and Advanced Practice Providers (APPs -  Physician Assistants and Nurse Practitioners) who all work together to provide you with the care you need, when you need it.  Your next appointment:   6 month(s)  Provider:   Little Ishikawa, MD       Signed, Little Ishikawa, MD  10/23/2023 11:36 PM    [facility]

## 2023-10-23 ENCOUNTER — Encounter: Payer: Self-pay | Admitting: Cardiology

## 2023-10-23 ENCOUNTER — Ambulatory Visit: Payer: Medicare HMO | Attending: Cardiology | Admitting: Cardiology

## 2023-10-23 VITALS — BP 148/82 | HR 90 | Ht 67.0 in | Wt 180.2 lb

## 2023-10-23 DIAGNOSIS — I480 Paroxysmal atrial fibrillation: Secondary | ICD-10-CM | POA: Diagnosis not present

## 2023-10-23 DIAGNOSIS — E785 Hyperlipidemia, unspecified: Secondary | ICD-10-CM

## 2023-10-23 DIAGNOSIS — I10 Essential (primary) hypertension: Secondary | ICD-10-CM

## 2023-10-23 DIAGNOSIS — R931 Abnormal findings on diagnostic imaging of heart and coronary circulation: Secondary | ICD-10-CM

## 2023-10-23 NOTE — Patient Instructions (Addendum)
 Medication Instructions:  No Changes  Lab Work: BMET and Magnesium, Today If you have labs (blood work) drawn today and your tests are completely normal, you will receive your results only by: MyChart Message (if you have MyChart) OR A paper copy in the mail If you have any lab test that is abnormal or we need to change your treatment, we will call you to review the results.   Testing/Procedures:    Please report to Radiology at the [facility] Main Entrance 30 minutes early for your test.  [address] [place], [place] [address]                         OR   Please report to Radiology at [facility] Main Entrance, medical mall, 30 mins prior to your test.  [address]  [place], [place]  How to Prepare for Your Cardiac PET/CT Stress Test:  Nothing to eat or drink, except water, 3 hours prior to arrival time.  NO caffeine/decaffeinated products, or chocolate 12 hours prior to arrival. (Please note decaffeinated beverages (teas/coffees) still contain caffeine).  If you have caffeine within 12 hours prior, the test will need to be rescheduled.  Medication instructions: Do not take erectile dysfunction medications for 72 hours prior to test (sildenafil, tadalafil) Do not take nitrates (isosorbide mononitrate, Ranexa) the day before or day of test Do not take tamsulosin the day before or morning of test Hold theophylline containing medications for 12 hours. Hold Dipyridamole 48 hours prior to the test.  Diabetic Preparation: If able to eat breakfast prior to 3 hour fasting, you may take all medications, including your insulin. Do not worry if you miss your breakfast dose of insulin - start at your next meal. If you do not eat prior to 3 hour fast-Hold all diabetes (oral and insulin) medications. Patients who wear a continuous glucose monitor MUST remove the device prior to scanning.  You may take your remaining medications with  water.  NO perfume, cologne or lotion on chest or abdomen area. FEMALES - Please avoid wearing dresses to this appointment.  Total time is 1 to 2 hours; you may want to bring reading material for the waiting time.  IF YOU THINK YOU MAY BE PREGNANT, OR ARE NURSING PLEASE INFORM THE TECHNOLOGIST.  In preparation for your appointment, medication and supplies will be purchased.  Appointment availability is limited, so if you need to cancel or reschedule, please call the Radiology Department Scheduler at [phone] 24 hours in advance to avoid a cancellation fee of $100.00  What to Expect When you Arrive:  Once you arrive and check in for your appointment, you will be taken to a preparation room within the Radiology Department.  A technologist or Nurse will obtain your medical history, verify that you are correctly prepped for the exam, and explain the procedure.  Afterwards, an IV will be started in your arm and electrodes will be placed on your skin for EKG monitoring during the stress portion of the exam. Then you will be escorted to the PET/CT scanner.  There, staff will get you positioned on the scanner and obtain a blood pressure and EKG.  During the exam, you will continue to be connected to the EKG and blood pressure machines.  A small, safe amount of a radioactive tracer will be injected in your IV to obtain a series of pictures of your heart along with an injection of  a stress agent.    After your Exam:  It is recommended that you eat a meal and drink a caffeinated beverage to counter act any effects of the stress agent.  Drink plenty of fluids for the remainder of the day and urinate frequently for the first couple of hours after the exam.  Your doctor will inform you of your test results within 7-10 business days.  For more information and frequently asked questions, please visit our website: [email or web address]  For questions about your test or how to prepare for your  test, please call: Cardiac Imaging Nurse Navigators Office: [phone]    Follow-Up: At [facility], you and your health needs are our priority.  As part of our continuing mission to provide you with exceptional heart care, we have created designated Provider Care Teams.  These Care Teams include your primary Cardiologist (physician) and Advanced Practice Providers (APPs -  Physician Assistants and Nurse Practitioners) who all work together to provide you with the care you need, when you need it.  Your next appointment:   6 month(s)  Provider:   Little Ishikawa, MD

## 2023-10-24 LAB — BASIC METABOLIC PANEL
BUN/Creatinine Ratio: 10 (ref 10–24)
BUN: 10 mg/dL (ref 8–27)
CO2: 26 mmol/L (ref 20–29)
Calcium: 9.6 mg/dL (ref 8.6–10.2)
Chloride: 100 mmol/L (ref 96–106)
Creatinine, Ser: 1.04 mg/dL (ref 0.76–1.27)
Glucose: 150 mg/dL — ABNORMAL HIGH (ref 70–99)
Potassium: 3.8 mmol/L (ref 3.5–5.2)
Sodium: 140 mmol/L (ref 134–144)
eGFR: 76 mL/min/{1.73_m2} (ref >59)

## 2023-10-24 LAB — MAGNESIUM: Magnesium: 1.8 mg/dL (ref 1.6–2.3)

## 2023-12-26 ENCOUNTER — Encounter (HOSPITAL_BASED_OUTPATIENT_CLINIC_OR_DEPARTMENT_OTHER): Payer: Self-pay | Admitting: Cardiology

## 2023-12-26 ENCOUNTER — Encounter: Payer: Self-pay | Admitting: Cardiology

## 2023-12-26 NOTE — Telephone Encounter (Signed)
 Called pt in regards to my chart message r/t Afib.  Reports has had intermittent AF since yesterday evening.  Noticed after riding recumbent bike for  min.  Occurred from 5 pm-10 pm pretty intense.  Today still occurring just not as often.  HR ranges 77-101 while sitting on couch watching TV.  Reports a small amount of dizziness.  Denies recent sickness, doesn't use alcohol, drinks at least 70 oz of water daily, no caffeine. Has not missed any doses of metoprolol  or Eliquis .  Has not checked BP today. Pt had an ablation  06/04/23 with Dr. Lawana Pray.   Advise pt will send to [facility] to advise.  Also advise to call 911 or go to ED if symptoms worsen.

## 2023-12-26 NOTE — Telephone Encounter (Signed)
 Pt also sent message to [facility].  Please see encounter addressed to [facility].

## 2023-12-27 NOTE — Telephone Encounter (Signed)
 There was a second chain of messages about this same phone call. See other notes for my response. They are working on an AF clinic visit.   Thank you!   Cody Das

## 2023-12-28 NOTE — Telephone Encounter (Signed)
 Pt has OV with AF clinic on 01/04/24.

## 2024-01-04 ENCOUNTER — Ambulatory Visit (HOSPITAL_COMMUNITY)
Admission: RE | Admit: 2024-01-04 | Discharge: 2024-01-04 | Disposition: A | Discharge status: Home / Self Care | Source: Ambulatory Visit | Attending: Internal Medicine | Admitting: Internal Medicine

## 2024-01-04 ENCOUNTER — Telehealth: Payer: Self-pay | Admitting: *Deleted

## 2024-01-04 ENCOUNTER — Encounter (HOSPITAL_COMMUNITY): Payer: Self-pay | Admitting: Internal Medicine

## 2024-01-04 VITALS — BP 152/80 | HR 72 | Ht 67.0 in | Wt 184.2 lb

## 2024-01-04 DIAGNOSIS — R931 Abnormal findings on diagnostic imaging of heart and coronary circulation: Secondary | ICD-10-CM

## 2024-01-04 DIAGNOSIS — D6869 Other thrombophilia: Secondary | ICD-10-CM

## 2024-01-04 DIAGNOSIS — I480 Paroxysmal atrial fibrillation: Secondary | ICD-10-CM

## 2024-01-04 MED ORDER — METOPROLOL TARTRATE 25 MG PO TABS: 25.0000 mg | ORAL_TABLET | Freq: Two times a day (BID) | ORAL | 3 refills | Status: DC | PRN

## 2024-01-04 NOTE — Telephone Encounter (Signed)
 Called and made patient aware that Dr. Alda Amas has ordered Exercise Myoview. Made patient aware that someone will reach out as soon as approved by insurance to get appointment scheduled. Patient verbalized an understanding.

## 2024-01-04 NOTE — Progress Notes (Signed)
 Primary Care Physician: Windell Hasty, DO Primary Cardiologist: Wendie Hamburg, MD Electrophysiologist: Will Cortland Ding, MD     Referring Physician: Arlin Benes [facility]     Kyle Gibson is a 73 y.o. male with a history of mild coronary and aortic atherosclerosis by imaging, HTN, T2DM, and paroxysmal atrial fibrillation who presents for consultation in the [facility]. Patient was seen in ED on 7/12 and 7/15 for palpitations found to be in Afib with RVR. He underwent emergent DCCV on 7/12 with successful conversion to NSR. He has been taking Toprol  25 mg daily. Patient is on Eliquis  for a CHADS2VASC score of 4.  On follow up 01/04/24, he is currently in NSR. He contacted office on 5/14 noting he was in Afib. He was in Afib from 5/13-14. He was under the impression he would not have any more Afib since ablation was done and is anxious about it. He has a BP diary with excellent home BP and HR measurements. He is on Toprol  25 mg daily. No missed doses of Eliquis .   Today, he denies symptoms of orthopnea, PND, lower extremity edema, dizziness, presyncope, syncope, snoring, daytime somnolence, bleeding, or neurologic sequela. The patient is tolerating medications without difficulties and is otherwise without complaint today.    Atrial Fibrillation Risk Factors:  he does not have symptoms or diagnosis of sleep apnea.   he has a BMI of Body mass index is 28.85 kg/m.Aaron Aas Filed Weights   01/04/24 0902  Weight: 83.6 kg    Current Outpatient Medications  Medication Sig Dispense Refill   acetaminophen  (TYLENOL ) 500 MG tablet Take 1,000 mg by mouth every 6 (six) hours as needed for moderate pain.     amLODipine (NORVASC) 10 MG tablet Take 10 mg by mouth in the morning.     apixaban  (ELIQUIS ) 5 MG TABS tablet Take 1 tablet (5 mg total) by mouth 2 (two) times daily. 180 tablet 3   Bismuth Subsalicylate (PEPTO-BISMOL PO) Take by mouth as needed.     famotidine  (PEPCID) 20 MG tablet Take 20 mg by mouth as needed for heartburn or indigestion.     hydrochlorothiazide (HYDRODIURIL) 25 MG tablet Take 25 mg by mouth in the morning.     Magnesium  Oxide -Mg Supplement 400 MG CAPS Take 400 mg daily 90 capsule 3   metFORMIN (GLUCOPHAGE-XR) 500 MG 24 hr tablet Take 1,000 mg by mouth 2 (two) times daily.     metoprolol  succinate (TOPROL -XL) 25 MG 24 hr tablet Take 1 tablet (25 mg total) by mouth daily. 90 tablet 2   metoprolol  tartrate (LOPRESSOR ) 25 MG tablet Take 1 tablet (25 mg total) by mouth 2 (two) times daily as needed (breakthrough afib HR over 100). 60 tablet 3   olmesartan (BENICAR) 40 MG tablet Take 40 mg by mouth every evening.     rosuvastatin (CRESTOR) 10 MG tablet Take 10 mg by mouth daily.     Semaglutide (OZEMPIC, 1 MG/DOSE, [place]) Inject 1 mg into the skin every Monday.     No current facility-administered medications for this encounter.    Atrial Fibrillation Management history:  Previous antiarrhythmic drugs: Multaq , amiodarone  Previous cardioversions: 02/23/23 Previous ablations: 06/04/23 Anticoagulation history: Eliquis  5 mg BID   ROS- All systems are reviewed and negative except as per the HPI above.  Physical Exam: BP (!) 152/80   Pulse 72   Ht 5\' 7"  (1.702 m)   Wt 83.6 kg   BMI 28.85 kg/m  GEN- The patient is well appearing, alert and oriented x 3 today.   Neck - no JVD or carotid bruit noted Lungs- Clear to ausculation bilaterally, normal work of breathing Heart- Regular rate and rhythm, no murmurs, rubs or gallops, PMI not laterally displaced Extremities- no clubbing, cyanosis, or edema Skin - no rash or ecchymosis noted   EKG today demonstrates  Vent. rate 72 BPM PR interval 206 ms QRS duration 84 ms QT/QTcB 382/418 ms P-R-T axes 76 36 4 Sinus rhythm with occasional Premature ventricular complexes Otherwise normal ECG When compared with ECG of 23-Oct-2023 11:25, No significant change was found  Echo  03/14/23: 1. Left ventricular ejection fraction, by estimation, is 50 to 55%. The  left ventricle has low normal function. The left ventricle has no regional  wall motion abnormalities. Left ventricular diastolic parameters were  normal.   2. Right ventricular systolic function is normal. The right ventricular  size is normal. Tricuspid regurgitation signal is inadequate for assessing  PA pressure.   3. The mitral valve is normal in structure. Trivial mitral valve  regurgitation. No evidence of mitral stenosis.   4. The aortic valve is tricuspid. Aortic valve regurgitation is not  visualized. No aortic stenosis is present.   5. The inferior vena cava is normal in size with greater than 50%  respiratory variability, suggesting right atrial pressure of 3 mmHg.   ASSESSMENT & PLAN CHA2DS2-VASc Score = 4  The patient's score is based upon: CHF History: 0 HTN History: 1 Diabetes History: 1 Stroke History: 0 Vascular Disease History: 1 Age Score: 1 Gender Score: 0       ASSESSMENT AND PLAN: Paroxysmal Atrial Fibrillation (ICD10:  I48.0) The patient's CHA2DS2-VASc score is 4, indicating a 4.8% annual risk of stroke. Mild coronary artery and aortic atherosclerosis by imaging; I will consider this a risk factor. S/p successful DCCV on 02/23/23. Patient states he is going in and out of Afib.  S/p Afib ablation on 06/04/23 by Dr. Lawana Pray.  He is currently in NSR. Education and reassurance provided regarding Afib burden post ablation. Will prescribe PRN Lopressor . Continue Toprol  25 mg daily.    Secondary Hypercoagulable State (ICD10:  D68.69) The patient is at significant risk for stroke/thromboembolism based upon his CHA2DS2-VASc Score of 4.  Continue Apixaban  (Eliquis ).  No missed doses.   CAC score of 1141 After discussion with Dr. Alda Amas, no indication to pursue antiplatelet therapy given he is on Eliquis . Continue Crestor.    Follow up with Dr. Lawana Pray as scheduled (recall  July).    Minnie Amber, PA-C  Afib Clinic [facility] [address] [place], [place] [address] [phone]

## 2024-01-04 NOTE — Patient Instructions (Signed)
 Start metoprolol  tartrate (lopressor ) 25mg  twice a day AS NEEDED for breakthrough afib heart rates over 100

## 2024-01-08 ENCOUNTER — Ambulatory Visit (HOSPITAL_COMMUNITY)

## 2024-01-08 ENCOUNTER — Encounter (HOSPITAL_COMMUNITY): Payer: Self-pay | Admitting: Cardiology

## 2024-01-09 ENCOUNTER — Other Ambulatory Visit: Payer: Self-pay | Admitting: Cardiology

## 2024-01-09 DIAGNOSIS — R931 Abnormal findings on diagnostic imaging of heart and coronary circulation: Secondary | ICD-10-CM

## 2024-01-11 ENCOUNTER — Telehealth (HOSPITAL_COMMUNITY): Payer: Self-pay | Admitting: *Deleted

## 2024-01-11 NOTE — Telephone Encounter (Signed)
 Spoke to patient and he was given detailed instructions about his STRESS TEST on 01/18/24 at 10:00. I also discussed and reminded him about not taking his Metoprolol  for 24 hours before the test but to bring his medication with him. His concerns were whether or not he would go into AFIB without his beta blocker.

## 2024-01-15 ENCOUNTER — Other Ambulatory Visit: Payer: Self-pay | Admitting: *Deleted

## 2024-01-15 DIAGNOSIS — R931 Abnormal findings on diagnostic imaging of heart and coronary circulation: Secondary | ICD-10-CM

## 2024-01-15 DIAGNOSIS — R079 Chest pain, unspecified: Secondary | ICD-10-CM

## 2024-01-15 DIAGNOSIS — I4891 Unspecified atrial fibrillation: Secondary | ICD-10-CM

## 2024-01-15 DIAGNOSIS — I480 Paroxysmal atrial fibrillation: Secondary | ICD-10-CM

## 2024-01-16 ENCOUNTER — Telehealth (HOSPITAL_COMMUNITY): Payer: Self-pay | Admitting: *Deleted

## 2024-01-16 NOTE — Telephone Encounter (Signed)
 Close encounter

## 2024-01-18 ENCOUNTER — Ambulatory Visit (HOSPITAL_COMMUNITY)
Admission: RE | Admit: 2024-01-18 | Discharge: 2024-01-18 | Disposition: A | Discharge status: Home / Self Care | Source: Ambulatory Visit | Attending: Internal Medicine | Admitting: Internal Medicine

## 2024-01-18 DIAGNOSIS — I700 Atherosclerosis of aorta: Secondary | ICD-10-CM | POA: Diagnosis not present

## 2024-01-18 DIAGNOSIS — R52 Pain, unspecified: Secondary | ICD-10-CM | POA: Diagnosis present

## 2024-01-18 DIAGNOSIS — I2510 Atherosclerotic heart disease of native coronary artery without angina pectoris: Secondary | ICD-10-CM | POA: Diagnosis not present

## 2024-01-18 DIAGNOSIS — R931 Abnormal findings on diagnostic imaging of heart and coronary circulation: Secondary | ICD-10-CM

## 2024-01-18 LAB — MYOCARDIAL PERFUSION IMAGING
Angina Index: 0
Base ST Depression (mm): 0 mm
Duke Treadmill Score: 6
Estimated workload: 7
Exercise duration (min): 6 min
Exercise duration (sec): 0 s
LV dias vol: 82 mL (ref 62–150)
LV sys vol: 38 mL
MPHR: 148 {beats}/min
Nuc Stress EF: 54 %
Peak HR: 151 {beats}/min
Percent HR: 102 %
Rest HR: 107 {beats}/min
Rest Nuclear Isotope Dose: 9.9 mCi
SDS: 0
SRS: 2
SSS: 2
ST Depression (mm): 0 mm
Stress Nuclear Isotope Dose: 32.6 mCi
TID: 1.26

## 2024-01-18 MED ORDER — TECHNETIUM TC 99M TETROFOSMIN IV KIT
32.6000 | PACK | Freq: Once | INTRAVENOUS | Status: AC | PRN
  Administered 2024-01-18: 32.6 via INTRAVENOUS

## 2024-01-18 MED ORDER — TECHNETIUM TC 99M TETROFOSMIN IV KIT
9.9000 | PACK | Freq: Once | INTRAVENOUS | Status: AC | PRN
Start: 2024-01-18 — End: 2024-01-18
  Administered 2024-01-18: 9.9 via INTRAVENOUS

## 2024-01-20 ENCOUNTER — Ambulatory Visit: Payer: Self-pay | Admitting: Cardiology

## 2024-03-06 NOTE — Progress Notes (Signed)
  Electrophysiology Office Note:   Date:  03/18/2024  ID:  Anguel Delapena, DOB 1950/12/27, MRN [id number]  Primary Cardiologist: Lonni LITTIE Nanas, MD Primary Heart Failure: None Electrophysiologist: Will Gladis Norton, MD      History of Present Illness:   Kyle Gibson is a 73 y.o. male, retired Runner, broadcasting/film/video / Production designer, theatre/television/film (wife Runner, broadcasting/film/video as well) with h/o AF, HTN, mild coronary & aortic atherosclerosis, DM II seen today for routine electrophysiology follow-up.   Since last being seen in our clinic the patient reports he has been doing well. He monitors his HR / BP several times per day and wears an Apple Watch.  His records are very detailed. He does report some fatigue.  He has not used his PRN lopressor . HR range from 60-90.  He had a stress test with Dr. Nanas and is set to follow up on those results soon.   He denies chest pain, palpitations, dyspnea, PND, orthopnea, nausea, vomiting, dizziness, syncope, edema, weight gain, or early satiety.    Review of systems complete and found to be negative unless listed in HPI.   EP Information / Studies Reviewed:    EKG is not ordered today. EKG from 01/04/24 reviewed which showed SR 72 bpm with PVC's       Arrhythmia / AAD AF  DCCV 02/23/23 EPS 05/15/23 > SR on presentation, PVI electrical isolation with PF energy, ablation of posterior wall   Risk Assessment/Calculations:    CHA2DS2-VASc Score = 4   This indicates a 4.8% annual risk of stroke. The patient's score is based upon: CHF History: 0 HTN History: 1 Diabetes History: 1 Stroke History: 0 Vascular Disease History: 1 Age Score: 1 Gender Score: 0             Physical Exam:   VS:  BP 124/72 (BP Location: Left Arm, Patient Position: Sitting, Cuff Size: Normal)   Pulse 85   Ht 5' 7 (1.702 m)   Wt 184 lb 9.6 oz (83.7 kg)   SpO2 97%   BMI 28.91 kg/m    Wt Readings from Last 3 Encounters:  03/18/24 184 lb 9.6 oz (83.7 kg)  01/18/24 184 lb (83.5 kg)  01/04/24 184 lb 3.2  oz (83.6 kg)     GEN: pleasant, well nourished, well developed in no acute distress NECK: No JVD; No carotid bruits CARDIAC: Regular rate and rhythm, no murmurs, rubs, gallops RESPIRATORY:  Clear to auscultation without rales, wheezing or rhonchi  ABDOMEN: Soft, non-tender, non-distended EXTREMITIES:  No edema; No deformity   ASSESSMENT AND PLAN:    Paroxysmal Atrial Fibrillation  CHA2DS2-VASc 4, s/p PVI + PW ablation -OAC for stroke prophylaxis  -continue Toprol  25 mg daily > discussed reduction due to fatigue but pt does not want to change out of fear of AF returning (as was his prior experience pre-ablation) -PRN lopressor  25mg  for AF breakthrough, HR > 100. Pt has not used.   -no objective evidence of AF after ablation     Secondary Hypercoagulable State  -continue Eliquis  5mg  BID, dose reviewed and appropriate by age / wt   Elevated CAC Score  HLD -risk modification per Cardiology   Hypertension  -well controlled on current regimen    Follow up with Dr. Norton in 12 months or sooner if new needs arise.   Signed, Daphne Barrack, NP-C, AGACNP-BC [facility] - Electrophysiology  03/18/2024, 1:26 PM

## 2024-03-18 ENCOUNTER — Encounter: Payer: Self-pay | Admitting: Pulmonary Disease

## 2024-03-18 ENCOUNTER — Ambulatory Visit: Attending: Cardiovascular Disease | Admitting: Pulmonary Disease

## 2024-03-18 VITALS — BP 124/72 | HR 85 | Ht 67.0 in | Wt 184.6 lb

## 2024-03-18 DIAGNOSIS — D6869 Other thrombophilia: Secondary | ICD-10-CM | POA: Diagnosis not present

## 2024-03-18 DIAGNOSIS — I480 Paroxysmal atrial fibrillation: Secondary | ICD-10-CM | POA: Diagnosis not present

## 2024-03-18 DIAGNOSIS — R931 Abnormal findings on diagnostic imaging of heart and coronary circulation: Secondary | ICD-10-CM

## 2024-03-18 DIAGNOSIS — I10 Essential (primary) hypertension: Secondary | ICD-10-CM | POA: Diagnosis not present

## 2024-03-18 NOTE — Patient Instructions (Signed)
 Medication Instructions:  Your physician recommends that you continue on your current medications as directed. Please refer to the Current Medication list given to you today.  *If you need a refill on your cardiac medications before your next appointment, please call your pharmacy*  Lab Work: None ordered If you have labs (blood work) drawn today and your tests are completely normal, you will receive your results only by: MyChart Message (if you have MyChart) OR A paper copy in the mail If you have any lab test that is abnormal or we need to change your treatment, we will call you to review the results.  Follow-Up: At [facility], you and your health needs are our priority.  As part of our continuing mission to provide you with exceptional heart care, our providers are all part of one team.  This team includes your primary Cardiologist (physician) and Advanced Practice Providers or APPs (Physician Assistants and Nurse Practitioners) who all work together to provide you with the care you need, when you need it.  Your next appointment:   1 year(s)  Provider:   Daphne Barrack, NP

## 2024-03-29 ENCOUNTER — Other Ambulatory Visit (HOSPITAL_COMMUNITY): Payer: Self-pay | Admitting: Internal Medicine

## 2024-05-11 NOTE — Progress Notes (Unsigned)
 Cardiology Office Note:    Date:  05/13/2024   ID:  Kyle Gibson, DOB 1951/02/25, MRN [id number]  PCP:  Valentin Skates, DO  Cardiologist:  Lonni LITTIE Nanas, MD  Electrophysiologist:  Soyla Gladis Norton, MD   Referring MD: Valentin Skates, DO   Chief Complaint  Patient presents with   Atrial Fibrillation    History of Present Illness:    Kyle Gibson is a 73 y.o. male with a hx of T2DM, hypertension, paroxysmal atrial fibrillation who presents for follow-up.  He was referred by Dr. Georgann for evaluation of atrial fibrillation, initially seen 10/23/2023.  He presented to ED twice in July 2024 with palpitations, found to be in A-fib with RVR.  Underwent cardioversion on 02/23/2023 with successful conversion to sinus rhythm.  He was started on Eliquis  and Toprol -XL.  He was seen in A-fib clinic 02/28/2023 and started on Multaq  and referred to EP to consider ablation.  He was seen by Dr. Norton on 04/13/2023 and reported diarrhea on Multaq , switched to amiodarone , with plans to proceed with ablation.  Echo 03/14/2023 showed EF 50 to 55%, normal RV function, no significant valvular disease, normal diastolic function.  Zio patch x 7 days 02/2023 showed 107 episodes of SVT, longest lasting 3 minutes 21 seconds with average rate 131 bpm, occasional PACs (1.4% of beats).  Exercise Myoview  01/18/2024 showed fair exercise capacity (7.0 METS), nonspecific ST changes on EKG, normal perfusion, LVEF 54%, severe coronary calcifications, low risk study.  Since last clinic visit, he reports he is doing well.  Denies any chest pain, dyspnea, lightheadedness, syncope, lower extremity edema, or palpitations.  He had to cut back from walking earlier this year due to back issues.  He reports it is improved, he is now back to walking 2 miles 4 to 5 days/week.  Denies any symptoms with this.  He has not noted any A-fib, though states that his watch has reported A-fib burden less than 2%.  He denies any bleeding on Eliquis .   He reports BP in 110s to 120s when checks at home.  Past Medical History:  Diagnosis Date   Diabetes mellitus without complication (HCC)    Hypertension     Past Surgical History:  Procedure Laterality Date   ATRIAL FIBRILLATION ABLATION N/A 06/04/2023   Procedure: ATRIAL FIBRILLATION ABLATION;  Surgeon: Norton Soyla Gladis, MD;  Location: [facility] INVASIVE CV LAB;  Service: Cardiovascular;  Laterality: N/A;    Current Medications: Current Meds  Medication Sig   amLODipine (NORVASC) 10 MG tablet Take 10 mg by mouth in the morning.   apixaban  (ELIQUIS ) 5 MG TABS tablet Take 1 tablet (5 mg total) by mouth 2 (two) times daily.   famotidine (PEPCID) 20 MG tablet Take 20 mg by mouth as needed for heartburn or indigestion.   FREESTYLE LITE test strip 1 each by Other route 2 (two) times daily.   hydrochlorothiazide (HYDRODIURIL) 25 MG tablet Take 25 mg by mouth in the morning.   Magnesium  Oxide -Mg Supplement 400 MG CAPS Take 400 mg daily   metFORMIN (GLUCOPHAGE-XR) 500 MG 24 hr tablet Take 1,000 mg by mouth 2 (two) times daily.   metoprolol  succinate (TOPROL -XL) 25 MG 24 hr tablet Take 1 tablet (25 mg total) by mouth daily.   metoprolol  tartrate (LOPRESSOR ) 25 MG tablet TAKE 1 TABLET BY MOUTH 2 TIMES DAILY AS NEEDED (BREAKTHROUGH AFIB HR OVER 100).   olmesartan (BENICAR) 40 MG tablet Take 40 mg by mouth every evening.   OZEMPIC, 2 MG/DOSE,  8 MG/3ML SOPN Inject 2 mg as directed once a week.   rosuvastatin (CRESTOR) 10 MG tablet Take 10 mg by mouth daily.     Allergies:   Canagliflozin, Carvedilol, Pregabalin, Empagliflozin, and Multaq  [dronedarone ]   Social History   Socioeconomic History   Marital status: Married    Spouse name: Not on file   Number of children: Not on file   Years of education: Not on file   Highest education level: Not on file  Occupational History   Not on file  Tobacco Use   Smoking status: Never   Smokeless tobacco: Never   Tobacco comments:    Never smoked  01/04/24  Vaping Use   Vaping status: Never Used  Substance and Sexual Activity   Alcohol use: Never   Drug use: Never   Sexual activity: Yes    Partners: Female    Comment: married  Other Topics Concern   Not on file  Social History Narrative   Not on file   Social Drivers of Health   Financial Resource Strain: Not on file  Food Insecurity: Not on file  Transportation Needs: Not on file  Physical Activity: Not on file  Stress: Not on file  Social Connections: Not on file     Family History: Father had CHF.  ROS:   Please see the history of present illness.     All other systems reviewed and are negative.  EKGs/Labs/Other Studies Reviewed:    The following studies were reviewed today:   EKG:   04/23/2023: Sinus rhythm, first-degree AV block, rate 79, no ST abnormalities 10/23/23: Normal sinus rhythm, rate 90, nonspecific T wave abnormality 05/12/2024: Sinus rhythm, rate 84, PVC, nonspecific T wave flattening  Recent Labs: 05/18/2023: Hemoglobin 14.3; Platelets 212 10/23/2023: BUN 10; Creatinine, Ser 1.04; Magnesium  1.8; Potassium 3.8; Sodium 140  Recent Lipid Panel    Component Value Date/Time   CHOL 134 04/23/2023 1056   TRIG 111 04/23/2023 1056   HDL 52 04/23/2023 1056   CHOLHDL 2.6 04/23/2023 1056   LDLCALC 62 04/23/2023 1056    Physical Exam:    VS:  BP (!) 146/92 (BP Location: Left Arm, Patient Position: Sitting)   Pulse 84   Ht 5' 7 (1.702 m)   Wt 184 lb 3.2 oz (83.6 kg)   SpO2 96%   BMI 28.85 kg/m     Wt Readings from Last 3 Encounters:  05/12/24 184 lb 3.2 oz (83.6 kg)  03/18/24 184 lb 9.6 oz (83.7 kg)  01/18/24 184 lb (83.5 kg)     GEN:  Well nourished, well developed in no acute distress HEENT: Normal NECK: No JVD; No carotid bruits LYMPHATICS: No lymphadenopathy CARDIAC: RRR, no murmurs, rubs, gallops RESPIRATORY:  Clear to auscultation without rales, wheezing or rhonchi  ABDOMEN: Soft, non-tender, non-distended MUSCULOSKELETAL:  No  edema; No deformity  SKIN: Warm and dry NEUROLOGIC:  Alert and oriented x 3 PSYCHIATRIC:  Normal affect   ASSESSMENT:    1. Coronary artery disease involving native coronary artery of native heart without angina pectoris   2. Paroxysmal atrial fibrillation (HCC)   3. Essential hypertension   4. Hyperlipidemia, unspecified hyperlipidemia type      PLAN:    Atrial fibrillation: He presented to ED twice in July 2024 with palpitations, found to be in A-fib with RVR.  Underwent cardioversion on 02/23/2023 with successful conversion to sinus rhythm.  He was started on Eliquis  and Toprol -XL.  He was seen in A-fib clinic 02/28/2023 and  started on Multaq  and referred to EP to consider ablation.  He was seen by Dr. Inocencio on 04/13/2023 and reported diarrhea on Multaq , switched to amiodarone , with plans to proceed with ablation.  Echo 03/14/2023 showed EF 50 to 55%, normal RV function, no significant valvular disease, normal diastolic function.  Zio patch x 7 days 02/2023 showed 107 episodes of SVT, longest lasting 3 minutes 21 seconds with average rate 131 bpm, occasional PACs (1.4% of beats).  Underwent A-fib ablation with Dr. Inocencio 05/2023.  Amiodarone  discontinued 07/2023 -Continue Eliquis , Toprol -XL  CAD: Calcium score on preablation CT was 1141 (86 percentile) on 05/25/2023.  Exercise Myoview  01/18/2024 showed fair exercise capacity (7.0 METS), nonspecific ST changes on EKG, normal perfusion, LVEF 54%, severe coronary calcifications, low risk study.  Denies anginal symptoms -Continue Eliquis , statin  Hypertension: On hydrochlorothiazide 25 mg, Toprol -XL 25 mg, amlodipine 10 mg daily, olmesartan 40 mg daily.  BP elevated in clinic today but appears controlled on home log.  Check BMET, magnesium   Hyperlipidemia: On rosuvastatin 10 mg daily.  LDL 62 on 04/23/2023, however was up to 91 on 01/02/2024.  Will repeat lipid panel as well as check LP(a).  If LDL above goal less than 70, will increase rosuvastatin  dose to 20 mg daily  OSA: Mild OSA on sleep study 04/2023, he declines treatment at this time  T2DM: On metformin and Ozempic.  A1c 7.8% on 01/02/2024  RTC in 6 months  Medication Adjustments/Labs and Tests Ordered: Current medicines are reviewed at length with the patient today.  Concerns regarding medicines are outlined above.  Orders Placed This Encounter  Procedures   Lipid panel   Lipoprotein A (LPA)   Basic Metabolic Panel (BMET)   Magnesium    EKG 12-Lead   No orders of the defined types were placed in this encounter.   Patient Instructions  Medication Instructions:  Your physician recommends that you continue on your current medications as directed. Please refer to the Current Medication list given to you today.  *If you need a refill on your cardiac medications before your next appointment, please call your pharmacy*  Lab Work: LIipid panel, bmet, mg, lipo A If you have labs (blood work) drawn today and your tests are completely normal, you will receive your results only by: MyChart Message (if you have MyChart) OR A paper copy in the mail If you have any lab test that is abnormal or we need to change your treatment, we will call you to review the results.  Testing/Procedures: none  Follow-Up: At [facility], you and your health needs are our priority.  As part of our continuing mission to provide you with exceptional heart care, our providers are all part of one team.  This team includes your primary Cardiologist (physician) and Advanced Practice Providers or APPs (Physician Assistants and Nurse Practitioners) who all work together to provide you with the care you need, when you need it.  Your next appointment:  6 month Provider:   Dr. Kate  We recommend signing up for the patient portal called MyChart.  Sign up information is provided on this After Visit Summary.  MyChart is used to connect with patients for Virtual Visits (Telemedicine).  Patients  are able to view lab/test results, encounter notes, upcoming appointments, etc.  Non-urgent messages can be sent to your provider as well.   To learn more about what you can do with MyChart, go to [email or web address].   Other Instructions none  Signed, Lonni LITTIE Nanas, MD  05/13/2024 12:13 PM    [facility]

## 2024-05-12 ENCOUNTER — Ambulatory Visit: Attending: Cardiology | Admitting: Cardiology

## 2024-05-12 ENCOUNTER — Encounter: Payer: Self-pay | Admitting: Cardiology

## 2024-05-12 VITALS — BP 146/92 | HR 84 | Ht 67.0 in | Wt 184.2 lb

## 2024-05-12 DIAGNOSIS — I10 Essential (primary) hypertension: Secondary | ICD-10-CM | POA: Diagnosis not present

## 2024-05-12 DIAGNOSIS — E785 Hyperlipidemia, unspecified: Secondary | ICD-10-CM | POA: Diagnosis not present

## 2024-05-12 DIAGNOSIS — I2510 Atherosclerotic heart disease of native coronary artery without angina pectoris: Secondary | ICD-10-CM | POA: Diagnosis not present

## 2024-05-12 DIAGNOSIS — I480 Paroxysmal atrial fibrillation: Secondary | ICD-10-CM

## 2024-05-12 NOTE — Patient Instructions (Addendum)
 Medication Instructions:  Your physician recommends that you continue on your current medications as directed. Please refer to the Current Medication list given to you today.  *If you need a refill on your cardiac medications before your next appointment, please call your pharmacy*  Lab Work: LIipid panel, bmet, mg, lipo A If you have labs (blood work) drawn today and your tests are completely normal, you will receive your results only by: MyChart Message (if you have MyChart) OR A paper copy in the mail If you have any lab test that is abnormal or we need to change your treatment, we will call you to review the results.  Testing/Procedures: none  Follow-Up: At [facility], you and your health needs are our priority.  As part of our continuing mission to provide you with exceptional heart care, our providers are all part of one team.  This team includes your primary Cardiologist (physician) and Advanced Practice Providers or APPs (Physician Assistants and Nurse Practitioners) who all work together to provide you with the care you need, when you need it.  Your next appointment:  6 month Provider:   Dr. Kate  We recommend signing up for the patient portal called MyChart.  Sign up information is provided on this After Visit Summary.  MyChart is used to connect with patients for Virtual Visits (Telemedicine).  Patients are able to view lab/test results, encounter notes, upcoming appointments, etc.  Non-urgent messages can be sent to your provider as well.   To learn more about what you can do with MyChart, go to [email or web address].   Other Instructions none

## 2024-05-13 ENCOUNTER — Other Ambulatory Visit: Payer: Self-pay | Admitting: *Deleted

## 2024-05-13 DIAGNOSIS — E785 Hyperlipidemia, unspecified: Secondary | ICD-10-CM

## 2024-05-13 DIAGNOSIS — I10 Essential (primary) hypertension: Secondary | ICD-10-CM

## 2024-05-13 LAB — LIPID PANEL

## 2024-05-14 ENCOUNTER — Ambulatory Visit: Payer: Self-pay | Admitting: Cardiology

## 2024-05-14 LAB — LIPID PANEL
Cholesterol, Total: 137 mg/dL (ref 100–199)
HDL: 50 mg/dL (ref >39)
LDL CALC COMMENT:: 2.7 ratio (ref 0.0–5.0)
LDL Chol Calc (NIH): 71 mg/dL (ref 0–99)
Triglycerides: 84 mg/dL (ref 0–149)
VLDL Cholesterol Cal: 16 mg/dL (ref 5–40)

## 2024-05-14 LAB — BASIC METABOLIC PANEL WITH GFR
BUN/Creatinine Ratio: 17 (ref 10–24)
BUN: 16 mg/dL (ref 8–27)
CO2: 23 mmol/L (ref 20–29)
Calcium: 9.6 mg/dL (ref 8.6–10.2)
Chloride: 100 mmol/L (ref 96–106)
Creatinine, Ser: 0.96 mg/dL (ref 0.76–1.27)
Glucose: 170 mg/dL — ABNORMAL HIGH (ref 70–99)
Potassium: 4 mmol/L (ref 3.5–5.2)
Sodium: 138 mmol/L (ref 134–144)
eGFR: 84 mL/min/1.73 (ref >59)

## 2024-05-14 LAB — MAGNESIUM: Magnesium: 2 mg/dL (ref 1.6–2.3)

## 2024-05-14 LAB — LIPOPROTEIN A (LPA): Lipoprotein (a): 69.2 nmol/L (ref <75.0)

## 2024-07-12 ENCOUNTER — Other Ambulatory Visit: Payer: Self-pay | Admitting: Cardiology

## 2024-09-01 ENCOUNTER — Encounter (HOSPITAL_COMMUNITY): Payer: Self-pay | Admitting: Internal Medicine

## 2024-09-01 ENCOUNTER — Ambulatory Visit (HOSPITAL_COMMUNITY)
Admission: RE | Admit: 2024-09-01 | Discharge: 2024-09-01 | Disposition: A | Discharge status: Home / Self Care | Source: Ambulatory Visit | Attending: Internal Medicine | Admitting: Internal Medicine

## 2024-09-01 VITALS — BP 158/88 | HR 90 | Ht 67.0 in | Wt 180.6 lb

## 2024-09-01 DIAGNOSIS — I480 Paroxysmal atrial fibrillation: Secondary | ICD-10-CM | POA: Diagnosis not present

## 2024-09-01 DIAGNOSIS — D6869 Other thrombophilia: Secondary | ICD-10-CM | POA: Diagnosis not present

## 2024-09-01 NOTE — Progress Notes (Signed)
 "   Primary Care Physician: Valentin Skates, DO Primary Cardiologist: Lonni LITTIE Nanas, MD Electrophysiologist: Will Gladis Norton, MD     Referring Physician: [facility]     Kyle Gibson is a 74 y.o. male with a history of mild coronary and aortic atherosclerosis by imaging, HTN, T2DM, and paroxysmal atrial fibrillation who presents for consultation in the [facility]. Patient was seen in ED on 7/12 and 7/15 for palpitations found to be in Afib with RVR. He underwent emergent DCCV on 7/12 with successful conversion to NSR. He has been taking Toprol  25 mg daily. Patient is on Eliquis  for a CHADS2VASC score of 4.   Follow-up 09/01/2024.  Patient appears to be maintaining SR.  He had one episode of Afib in December for ~4 hours but it resolved after taking metoprolol  tartrate. He takes Toprol  25 mg daily. No bleeding issues on Eliquis .  Today, he denies symptoms of orthopnea, PND, lower extremity edema, dizziness, presyncope, syncope, snoring, daytime somnolence, bleeding, or neurologic sequela. The patient is tolerating medications without difficulties and is otherwise without complaint today.    Atrial Fibrillation Risk Factors:  he does not have symptoms or diagnosis of sleep apnea.   he has a BMI of Body mass index is 28.29 kg/m.SABRA Filed Weights   09/01/24 1056  Weight: 81.9 kg     Current Outpatient Medications  Medication Sig Dispense Refill   amLODipine (NORVASC) 10 MG tablet Take 10 mg by mouth in the morning.     apixaban  (ELIQUIS ) 5 MG TABS tablet Take 1 tablet (5 mg total) by mouth 2 (two) times daily. 180 tablet 3   famotidine (PEPCID) 20 MG tablet Take 20 mg by mouth as needed for heartburn or indigestion.     FREESTYLE LITE test strip 1 each by Other route 2 (two) times daily.     hydrochlorothiazide (HYDRODIURIL) 25 MG tablet Take 25 mg by mouth in the morning.     magnesium  oxide (MAG-OX) 400 (240 Mg) MG tablet TAKE 1 TABLET BY  MOUTH EVERY DAY 90 tablet 2   metFORMIN (GLUCOPHAGE-XR) 500 MG 24 hr tablet Take 1,000 mg by mouth 2 (two) times daily.     metoprolol  succinate (TOPROL -XL) 25 MG 24 hr tablet Take 1 tablet (25 mg total) by mouth daily. 90 tablet 2   metoprolol  tartrate (LOPRESSOR ) 25 MG tablet TAKE 1 TABLET BY MOUTH 2 TIMES DAILY AS NEEDED (BREAKTHROUGH AFIB HR OVER 100). 30 tablet 6   MOUNJARO 10 MG/0.5ML Pen Inject 10 mg into the skin once a week.     olmesartan (BENICAR) 40 MG tablet Take 40 mg by mouth every evening.     rosuvastatin (CRESTOR) 10 MG tablet Take 10 mg by mouth daily.     No current facility-administered medications for this encounter.    Atrial Fibrillation Management history:  Previous antiarrhythmic drugs: Multaq , amiodarone  Previous cardioversions: 02/23/23 Previous ablations: 06/04/23 Anticoagulation history: Eliquis  5 mg BID   ROS- All systems are reviewed and negative except as per the HPI above.  Physical Exam: BP (!) 158/88   Pulse 90   Ht 5' 7 (1.702 m)   Wt 81.9 kg   BMI 28.29 kg/m   GEN- The patient is well appearing, alert and oriented x 3 today.   Neck - no JVD or carotid bruit noted Lungs- Clear to ausculation bilaterally, normal work of breathing Heart- Regular rate and rhythm, no murmurs, rubs or gallops, PMI not laterally displaced Extremities- no clubbing,  cyanosis, or edema Skin - no rash or ecchymosis noted    EKG today demonstrates  EKG Interpretation Date/Time:  Monday September 01 2024 10:58:29 EST Ventricular Rate:  90 PR Interval:  186 QRS Duration:  84 QT Interval:  366 QTC Calculation: 447 R Axis:   34  Text Interpretation: Normal sinus rhythm Nonspecific T wave abnormality Abnormal ECG When compared with ECG of 12-May-2024 08:43, Premature ventricular complexes are no longer Present Confirmed by Terra Pac ([id number]) on 09/01/2024 11:01:39 AM    Echo 03/14/23: 1. Left ventricular ejection fraction, by estimation, is 50 to 55%. The  left  ventricle has low normal function. The left ventricle has no regional  wall motion abnormalities. Left ventricular diastolic parameters were  normal.   2. Right ventricular systolic function is normal. The right ventricular  size is normal. Tricuspid regurgitation signal is inadequate for assessing  PA pressure.   3. The mitral valve is normal in structure. Trivial mitral valve  regurgitation. No evidence of mitral stenosis.   4. The aortic valve is tricuspid. Aortic valve regurgitation is not  visualized. No aortic stenosis is present.   5. The inferior vena cava is normal in size with greater than 50%  respiratory variability, suggesting right atrial pressure of 3 mmHg.   ASSESSMENT & PLAN CHA2DS2-VASc Score = 4  The patient's score is based upon: CHF History: 0 HTN History: 1 Diabetes History: 1 Stroke History: 0 Vascular Disease History: 1 Age Score: 1 Gender Score: 0       ASSESSMENT AND PLAN: Paroxysmal Atrial Fibrillation (ICD10:  I48.0) The patient's CHA2DS2-VASc score is 4, indicating a 4.8% annual risk of stroke. Mild coronary artery and aortic atherosclerosis by imaging; I will consider this a risk factor. S/p successful DCCV on 02/23/23. Patient states he is going in and out of Afib.  S/p Afib ablation on 06/04/23 by Dr. Inocencio.  Patient is currently in NSR.  Continue Toprol  25 mg daily.  He is happy with current management we will not make any changes at this time.  Secondary Hypercoagulable State (ICD10:  D68.69) The patient is at significant risk for stroke/thromboembolism based upon his CHA2DS2-VASc Score of 4.  Continue Apixaban  (Eliquis ).  Continue Eliquis  5 mg twice daily.  CAC score of 1141 After discussion with Dr. Kate, no indication to pursue antiplatelet therapy given he is on Eliquis . Continue Crestor.    Follow up 6 months Afib clinic.   Terra Pac, PA-C  Afib Clinic [facility] [address] [place], [place]  [address] [phone]  "

## 2025-03-09 ENCOUNTER — Ambulatory Visit (HOSPITAL_COMMUNITY): Admitting: Internal Medicine
